# Patient Record
Sex: Female | Born: 1965
Health system: Southern US, Community
[De-identification: ages and names within clinical notes are randomized; demographics above are authoritative.]

## PROBLEM LIST (undated history)

## (undated) DIAGNOSIS — M1990 Unspecified osteoarthritis, unspecified site: Secondary | ICD-10-CM

## (undated) DIAGNOSIS — R7303 Prediabetes: Secondary | ICD-10-CM

## (undated) DIAGNOSIS — D649 Anemia, unspecified: Secondary | ICD-10-CM

## (undated) HISTORY — PX: DILATION AND CURETTAGE OF UTERUS: SHX78

## (undated) HISTORY — PX: WISDOM TOOTH EXTRACTION: SHX21

---

## 1998-11-01 ENCOUNTER — Other Ambulatory Visit: Admission: RE | Admit: 1998-11-01 | Discharge: 1998-11-01 | Payer: Self-pay | Admitting: Obstetrics and Gynecology

## 1999-11-11 ENCOUNTER — Other Ambulatory Visit: Admission: RE | Admit: 1999-11-11 | Discharge: 1999-11-11 | Payer: Self-pay | Admitting: Obstetrics and Gynecology

## 2000-11-20 ENCOUNTER — Other Ambulatory Visit: Admission: RE | Admit: 2000-11-20 | Discharge: 2000-11-20 | Payer: Self-pay | Admitting: Obstetrics and Gynecology

## 2001-12-02 ENCOUNTER — Other Ambulatory Visit: Admission: RE | Admit: 2001-12-02 | Discharge: 2001-12-02 | Payer: Self-pay | Admitting: *Deleted

## 2001-12-02 ENCOUNTER — Other Ambulatory Visit: Admission: RE | Admit: 2001-12-02 | Discharge: 2001-12-02 | Payer: Self-pay | Admitting: Obstetrics and Gynecology

## 2003-01-17 ENCOUNTER — Other Ambulatory Visit: Admission: RE | Admit: 2003-01-17 | Discharge: 2003-01-17 | Payer: Self-pay | Admitting: Obstetrics and Gynecology

## 2004-01-25 ENCOUNTER — Other Ambulatory Visit: Admission: RE | Admit: 2004-01-25 | Discharge: 2004-01-25 | Payer: Self-pay | Admitting: Obstetrics and Gynecology

## 2005-04-29 ENCOUNTER — Other Ambulatory Visit: Admission: RE | Admit: 2005-04-29 | Discharge: 2005-04-29 | Payer: Self-pay | Admitting: Obstetrics and Gynecology

## 2005-08-01 ENCOUNTER — Encounter (INDEPENDENT_AMBULATORY_CARE_PROVIDER_SITE_OTHER): Payer: Self-pay | Admitting: Specialist

## 2005-08-01 ENCOUNTER — Ambulatory Visit (HOSPITAL_COMMUNITY): Admission: RE | Admit: 2005-08-01 | Discharge: 2005-08-01 | Payer: Self-pay | Admitting: Obstetrics and Gynecology

## 2010-10-23 NOTE — H&P (Signed)
  Brianna Morton, Brianna Morton             ACCOUNT NO.:  [id number]  MEDICAL RECORD NO.:  [id number]        PATIENT TYPE:  WAMB  LOCATION:                                [facility]  [facility]  PHYSICIAN:  Duke Salvia. Marcelle Overlie, M.D.DATE OF BIRTH:  1965/12/06  DATE OF ADMISSION:  10/24/2010 DATE OF DISCHARGE:                             HISTORY & PHYSICAL   CHIEF COMPLAINT:  Menorrhagia.  HISTORY OF PRESENT ILLNESS:  A 45 year old, G1, P1 using condoms for contraception as noted worsening problems with menorrhagia over the last 6 months.  SHD October 07, 2010, shows a well-defined endometrial polyp. She has had a prior hysteroscopy and D and C in 2006 for the same problem.  Additionally, she is known to have fibroids, but does not want to pursue hysterectomy at this time.  Most recent ultrasound showed fibroids, the largest were 5.3, 5.3 and 3.7 cm and several smaller, two polyps 14 and 21-mm was noted on saline infusion.  She presents now for D and C, hysteroscopy.  This procedure including risks related to bleeding, infection, other complications that may require additional surgery all reviewed with her which she understands and accepts.  PAST MEDICAL HISTORY:  ALLERGIES:  None.  PRIOR OPERATIONS:  D and C, hysteroscopy in 2006, a vaginal delivery in 1992.  CURRENT MEDICATIONS:  Over-the-counter vitamins once daily.  REVIEW OF SYSTEMS:  Significant for anemia.  FAMILY HISTORY:  Significant for heart disease and breast cancer in an aunt.  SOCIAL HISTORY:  Denies alcohol, tobacco or drug use.  She is single. [facility] is her PCP.  PHYSICAL EXAMINATION:  VITAL SIGNS:  Temperature 98.2, blood pressure 128/72. HEENT:  Unremarkable. NECK:  Supple without masses. LUNGS:  Clear. CARDIOVASCULAR:  Rate and rhythm without murmurs, rubs or gallops. BREASTS:  Without masses. ABDOMEN:  Soft, flat and nontender. PELVIC:  Normal external genitalia.  Vagina and cervix  clear.  Uterus was 10-week size, irregular.  Adnexa negative.  IMPRESSION: 1. Leiomyoma. 2. Menorrhagia. 3. Endometrial polyps.  PLAN:  D and C, hysteroscopy.  Procedure and risks reviewed as above up.     [name] M. Marcelle Overlie, M.D.     [facility]/MEDQ  D:  10/18/2010  T:  10/19/2010  Job:  [id number]  Electronically Signed by Richarda Overlie M.D. on 10/23/2010 03:12:19 PM

## 2010-10-24 ENCOUNTER — Other Ambulatory Visit: Payer: Self-pay | Admitting: Obstetrics and Gynecology

## 2010-10-24 ENCOUNTER — Ambulatory Visit (HOSPITAL_COMMUNITY)
Admission: RE | Admit: 2010-10-24 | Discharge: 2010-10-24 | Disposition: A | Discharge status: Home / Self Care | Payer: BC Managed Care – PPO | Source: Ambulatory Visit | Attending: Obstetrics and Gynecology | Admitting: Obstetrics and Gynecology

## 2010-10-24 DIAGNOSIS — D251 Intramural leiomyoma of uterus: Secondary | ICD-10-CM | POA: Insufficient documentation

## 2010-10-24 DIAGNOSIS — D252 Subserosal leiomyoma of uterus: Secondary | ICD-10-CM | POA: Insufficient documentation

## 2010-10-24 DIAGNOSIS — N920 Excessive and frequent menstruation with regular cycle: Secondary | ICD-10-CM | POA: Insufficient documentation

## 2010-10-24 DIAGNOSIS — N840 Polyp of corpus uteri: Secondary | ICD-10-CM | POA: Insufficient documentation

## 2010-10-24 DIAGNOSIS — D250 Submucous leiomyoma of uterus: Secondary | ICD-10-CM | POA: Insufficient documentation

## 2010-10-24 LAB — CBC
MCH: 19.3 pg — ABNORMAL LOW (ref 26.0–34.0)
MCHC: 30.1 g/dL (ref 30.0–36.0)
MCV: 64.1 fL — ABNORMAL LOW (ref 78.0–100.0)
Platelets: 314 10*3/uL (ref 150–400)
RDW: 19.2 % — ABNORMAL HIGH (ref 11.5–15.5)

## 2010-10-25 NOTE — Op Note (Signed)
  Brianna Morton, Brianna Morton             ACCOUNT NO.:  [id number]  MEDICAL RECORD NO.:  [id number]           PATIENT TYPE:  O  LOCATION:  [facility]                          [facility]  [facility]  PHYSICIAN:  Duke Salvia. Marcelle Overlie, M.D.DATE OF BIRTH:  [birth year]  DATE OF PROCEDURE:  10/24/2010 DATE OF DISCHARGE:                              OPERATIVE REPORT   PREOPERATIVE DIAGNOSES:  Leiomyoma, abnormal uterine bleeding.  POSTOPERATIVE DIAGNOSES:  Leiomyoma, abnormal uterine bleeding.  PROCEDURE:  D and C, hysteroscopy.  SURGEON:  Duke Salvia. Marcelle Overlie, M.D.  ANESTHESIA:  General.  COMPLICATIONS:  None.  DRAINS:  Foley catheter.  ESTIMATED BLOOD LOSS:  Minimal.  SPECIMENS:  Endometrial curettings to Pathology.  PROCEDURE AND FINDINGS:  The patient was taken to the operating room after an adequate level of general anesthesia was obtained.  With the patient's legs in stirrups, the abdomen, perineum, and vagina were prepped and draped for D and C.  The bladder was drained, EUA carried out, uterus was 12-week size from known leiomyoma, adnexa negative. Uterus was mobile.  Cervix grasped with tenaculum.  Paracervical block created by infiltrating at 3 and 9 o'clock submucosally 5-7 mL 1% Xylocaine on either side after negative aspiration.  The uterus was sounded to 11 cm, progressively dilated to 27 [place].  The continuous flow hysteroscope was inserted.  An intramural partially submucous fibroid was noted on the right lateral wall.  No other abnormalities were noted.  Sharp curettage was carried out and sent as endometrial curetting specimen.  The scope was reinserted.  The cavity was irrigated.  Further assessment of the fibroids, especially since she has so many other multiple intramural and serosal fibroids, was that this fibroid was not readily accessible for complete excision.  The procedure was stopped at that point.  She tolerated this well, went to recovery room in good  condition.    Hashir Deleeuw M. Marcelle Overlie, M.D.    RM[facility]/MEDQ  D:  10/24/2010  T:  10/25/2010  Job:  [id number]  Electronically Signed by Richarda Overlie M.D. on 10/25/2010 11:37:17 AM

## 2011-02-07 NOTE — Op Note (Signed)
NAMEJASMENE, Brianna Morton             ACCOUNT NO.:  [id number]   MEDICAL RECORD NO.:  [id number]          PATIENT TYPE:  AMB   LOCATION:  [facility]                           [facility]ILITY[facility]  [facility]   PHYSICIAN:  Duke Salvia. Marcelle Overlie, M.D.DATE OF BIRTH:  1965/11/14   DATE OF PROCEDURE:  08/01/2005  DATE OF DISCHARGE:                                 OPERATIVE REPORT   PREOPERATIVE DIAGNOSIS:  Leiomyoma with anemia, endometrial polyp,  menorrhagia.   POSTOPERATIVE DIAGNOSIS:  Leiomyoma with anemia, endometrial polyp,  menorrhagia.   PROCEDURE:  D&C hysteroscopy.   SURGEON:  Duke Salvia. Marcelle Overlie, M.D.   ANESTHESIA:  Paracervical block plus sedation.   DESCRIPTION OF PROCEDURE:  The patient was taken to the operating room.  After adequate level of sedation was obtained with the patient's legs  stirrups. The perineum and vagina prepped and draped with Betadine. The  bladder was drained, EUA carried out. Uterus was upper limits normal size.  Adnexa negative. Speculum was positioned. Cervix grasped with tenaculum.  Paracervical block created by infiltrating at 3 and 9 o'clock submucosally 5  to 7 mL of 1% Xylocaine on either side after negative aspiration. The uterus  was then sounded to 9 cm, progressively dilated to 27-29 [facility] dilator. The  7 mm continuous flow hysteroscope was inserted and a moderate amount of  tissue was noted. The scope was removed. D&C was carried out. Endometrial  curettings were submitted to pathology. The scope was reinserted. The cavity  was irrigated and all four walls were noted to be clear and unremarkable.  Procedure was discontinued at that point. The patient went to recovery room  in good condition.      Richard M. Marcelle Overlie, M.D.  Electronically Signed     RMH/MEDQ  D:  08/01/2005  T:  08/01/2005  Job:  [id number]

## 2011-02-07 NOTE — H&P (Signed)
Brianna Morton, Brianna Morton             ACCOUNT NO.:  [id number]   MEDICAL RECORD NO.:  [id number]          PATIENT TYPE:  AMB   LOCATION:  [facility]                           [facility]   PHYSICIAN:  Duke Salvia. Marcelle Overlie, M.D.DATE OF BIRTH:  01/14/1966   DATE OF ADMISSION:  08/01/2005  DATE OF DISCHARGE:                                HISTORY & PHYSICAL   CHIEF COMPLAINT:  Anemia with menorrhagia.   HISTORY OF PRESENT ILLNESS:  A 45 year old G1, P1 currently using condoms.  She has a history of leiomyoma and presented in August of this year with the  uterus 10 weeks size by exam, was started on iron at that point, and a  sonohystogram was done that showed fibroids measuring 6.2 x 5.1, 5.6 x 5.3,  3.2 x 2.6, with a 19 mm polyp noted within the uterine cavity.  We discussed  several options including hysterectomy, which she declines.  She prefers to  be more conservative, and presents now for a D&C hysteroscopy to remove the  polyp.  This procedure including risks of bleeding, infection, adjacent  organ injury, or other complications that may require additional surgery  were all reviewed.  Hemoglobin in August was 8.6.  She has been on  prescription iron once daily.   PAST MEDICAL HISTORY:   SURGICAL HISTORY:  One vaginal delivery at term.   FAMILY HISTORY:  Noncontributory.   LABORATORY DATA:  Last Pap in August of 2006 was normal.  Lipid screen on  June 19, 2005 showed hypercholesterolemia.   PHYSICAL EXAMINATION:  VITAL SIGNS:  Temperature 98.2, blood pressure  110/84.  HEENT:  Unremarkable.  NECK:  Supple without masses.  LUNGS:  Clear.  CARDIOVASCULAR:  Regular rate and rhythm without murmurs, rubs, or gallops.  BREASTS:  Without masses.  ABDOMEN:  Soft, flat, nontender.  PELVIC:  Normal external genitalia.  Vagina and cervix clear.  Uterus is 10  weeks size.  Adnexa negative.  EXTREMITIES/NEUROLOGIC:  Unremarkable.   IMPRESSION:  Leiomyoma with endometrial polyp and  anemia secondary to  menorrhagia.   PLAN:  D&C hysterectomy.  Procedure and risks discussed, as above.     Richard M. Marcelle Overlie, M.D.  Electronically Signed    RMH/MEDQ  D:  07/28/2005  T:  07/28/2005  Job:  [id number]

## 2013-09-01 ENCOUNTER — Telehealth: Payer: Self-pay | Admitting: Family Medicine

## 2013-09-01 NOTE — Telephone Encounter (Signed)
appt scheduled

## 2013-09-02 ENCOUNTER — Ambulatory Visit: Payer: Self-pay | Admitting: Family Medicine

## 2013-09-09 ENCOUNTER — Ambulatory Visit: Payer: Self-pay | Admitting: Family Medicine

## 2013-10-06 ENCOUNTER — Ambulatory Visit: Payer: Self-pay | Admitting: Family Medicine

## 2014-11-14 ENCOUNTER — Encounter: Payer: Self-pay | Admitting: Nurse Practitioner

## 2014-11-14 ENCOUNTER — Ambulatory Visit (INDEPENDENT_AMBULATORY_CARE_PROVIDER_SITE_OTHER): Payer: Managed Care, Other (non HMO) | Admitting: Nurse Practitioner

## 2014-11-14 VITALS — BP 166/87 | HR 89 | Temp 97.9°F | Ht 63.0 in | Wt 292.0 lb

## 2014-11-14 DIAGNOSIS — R300 Dysuria: Secondary | ICD-10-CM

## 2014-11-14 DIAGNOSIS — N3001 Acute cystitis with hematuria: Secondary | ICD-10-CM

## 2014-11-14 DIAGNOSIS — N760 Acute vaginitis: Secondary | ICD-10-CM

## 2014-11-14 LAB — POCT URINALYSIS DIPSTICK
Bilirubin, UA: NEGATIVE
Glucose, UA: NEGATIVE
Ketones, UA: NEGATIVE
NITRITE UA: NEGATIVE
PH UA: 5
Protein, UA: NEGATIVE
RBC UA: NEGATIVE
Spec Grav, UA: 1.02
UROBILINOGEN UA: NEGATIVE

## 2014-11-14 LAB — POCT UA - MICROSCOPIC ONLY
Bacteria, U Microscopic: NEGATIVE
CASTS, UR, LPF, POC: NEGATIVE
Crystals, Ur, HPF, POC: NEGATIVE
MUCUS UA: NEGATIVE
RBC, urine, microscopic: NEGATIVE

## 2014-11-14 MED ORDER — CIPROFLOXACIN HCL 500 MG PO TABS: 500.0000 mg | ORAL_TABLET | Freq: Two times a day (BID) | ORAL | Status: DC

## 2014-11-14 MED ORDER — FLUCONAZOLE 150 MG PO TABS: 150.0000 mg | ORAL_TABLET | Freq: Once | ORAL | Status: DC

## 2014-11-14 NOTE — Progress Notes (Signed)
  Subjective:    Brianna Morton is a 49 y.o. female who complains of abnormal smelling urine, foul smelling urine and frequency. She has had symptoms for 2 weeks. Patient also complains of vaginal discharge. Patient denies back pain, congestion and fever. Patient does not have a history of recurrent UTI. Patient does not have a history of pyelonephritis.   The following portions of the patient's history were reviewed and updated as appropriate: allergies, current medications, past family history, past medical history, past social history, past surgical history and problem list.  Review of Systems Pertinent items are noted in HPI.    Objective:    BP 166/87 mmHg  Pulse 89  Temp(Src) 97.9 F (36.6 C) (Oral)  Ht 5\' 3"  (1.6 m)  Wt 292 lb (132.45 kg)  BMI 51.74 kg/m2  LMP 06/14/2014 General appearance: alert and cooperative Lungs: clear to auscultation bilaterally Heart: regular rate and rhythm, S1, S2 normal, no murmur, click, rub or gallop Abdomen: soft, non-tender; bowel sounds normal; no masses,  no organomegaly  Laboratory:  Results for orders placed or performed in visit on 11/14/14  POCT UA - Microscopic Only  Result Value Ref Range   WBC, Ur, HPF, POC 10-15    RBC, urine, microscopic neg    Bacteria, U Microscopic neg    Mucus, UA neg    Epithelial cells, urine per micros occ    Crystals, Ur, HPF, POC neg    Casts, Ur, LPF, POC neg    Yeast, UA occ   POCT urinalysis dipstick  Result Value Ref Range   Color, UA yellow    Clarity, UA cloudy    Glucose, UA neg    Bilirubin, UA neg    Ketones, UA neg    Spec Grav, UA 1.020    Blood, UA neg    pH, UA 5.0    Protein, UA neg    Urobilinogen, UA negative    Nitrite, UA neg    Leukocytes, UA Trace      Assessment:    Acute cystitis and Vulvovaginitis     Plan:      1. Dysuria - POCT UA - Microscopic Only - POCT urinalysis dipstick  2. Acute cystitis with hematuria [N30.01] Take medication as  prescribe Cotton underwear Take shower not bath Cranberry juice, yogurt Force fluids AZO over the counter X2 days Culture pending RTO prn - ciprofloxacin (CIPRO) 500 MG tablet; Take 1 tablet (500 mg total) by mouth 2 (two) times daily.  Dispense: 6 tablet; Refill: 0  3. Vaginitis and vulvovaginitis - fluconazole (DIFLUCAN) 150 MG tablet; Take 1 tablet (150 mg total) by mouth once.  Dispense: 1 tablet; Refill: 0   Mary-Margaret Hassell Done, FNP

## 2014-11-14 NOTE — Patient Instructions (Signed)

## 2015-01-24 ENCOUNTER — Ambulatory Visit: Payer: Managed Care, Other (non HMO) | Admitting: Family

## 2015-01-26 ENCOUNTER — Encounter: Payer: Self-pay | Admitting: Nurse Practitioner

## 2015-01-26 ENCOUNTER — Ambulatory Visit (INDEPENDENT_AMBULATORY_CARE_PROVIDER_SITE_OTHER): Payer: Managed Care, Other (non HMO) | Admitting: Nurse Practitioner

## 2015-01-26 VITALS — BP 156/90 | HR 103 | Temp 99.2°F | Ht 63.0 in | Wt 294.0 lb

## 2015-01-26 DIAGNOSIS — R300 Dysuria: Secondary | ICD-10-CM

## 2015-01-26 LAB — POCT URINALYSIS DIPSTICK
BILIRUBIN UA: NEGATIVE
GLUCOSE UA: NEGATIVE
KETONES UA: NEGATIVE
LEUKOCYTES UA: NEGATIVE
NITRITE UA: NEGATIVE
Protein, UA: NEGATIVE
Spec Grav, UA: 1.01
Urobilinogen, UA: NEGATIVE
pH, UA: 6.5

## 2015-01-26 LAB — POCT UA - MICROSCOPIC ONLY
BACTERIA, U MICROSCOPIC: NEGATIVE
CASTS, UR, LPF, POC: NEGATIVE
CRYSTALS, UR, HPF, POC: NEGATIVE
MUCUS UA: NEGATIVE
WBC, Ur, HPF, POC: NEGATIVE
YEAST UA: NEGATIVE

## 2015-01-26 NOTE — Patient Instructions (Signed)

## 2015-01-26 NOTE — Progress Notes (Signed)
  Subjective:     CHIZUKO TRINE is a 49 y.o. female who presents for evaluation of sinus pain. Symptoms include: congestion, cough, fevers and sinus pressure. Onset of symptoms was 2 days ago. Symptoms have been unchanged since that time. Past history is significant for no history of pneumonia or bronchitis. Patient is a non-smoker.  * had a uti 1 month ago and just wants urine checked.  The following portions of the patient's history were reviewed and updated as appropriate: allergies, current medications, past family history, past medical history, past social history, past surgical history and problem list.  Review of Systems Pertinent items are noted in HPI.   Objective:    BP 156/90 mmHg  Pulse 103  Temp(Src) 99.2 F (37.3 C) (Oral)  Ht 5\' 3"  (1.6 m)  Wt 294 lb (133.358 kg)  BMI 52.09 kg/m2 General appearance: alert and cooperative Eyes: conjunctivae/corneas clear. PERRL, EOM's intact. Fundi benign. Ears: normal TM's and external ear canals both ears Nose: Nares normal. Septum midline. Mucosa normal. No drainage or sinus tenderness. mild congestion Throat: lips, mucosa, and tongue normal; teeth and gums normal Neck: no adenopathy, no carotid bruit, no JVD, supple, symmetrical, trachea midline and thyroid not enlarged, symmetric, no tenderness/mass/nodules Lungs: clear to auscultation bilaterally Heart: regular rate and rhythm, S1, S2 normal, no murmur, click, rub or gallop    Urinalysis clear    Assessment:    Acute viral sinusitis.    Plan:   1. Take meds as prescribed 2. Use a cool mist humidifier especially during the winter months and when heat has been humid. 3. Use saline nose sprays frequently 4. Saline irrigations of the nose can be very helpful if done frequently.  * 4X daily for 1 week*  * Use of a nettie pot can be helpful with this. Follow directions with this* 5. Drink plenty of fluids 6. Keep thermostat turn down low 7.For any cough or  congestion  Use plain Mucinex- regular strength or max strength is fine   * Children- consult with Pharmacist for dosing 8. For fever or aces or pains- take tylenol or ibuprofen appropriate for age and weight.  * for fevers greater than 101 orally you may alternate ibuprofen and tylenol every  3 hours.   Mary-Margaret Hassell Done, FNP

## 2015-01-29 ENCOUNTER — Telehealth: Payer: Self-pay | Admitting: Nurse Practitioner

## 2015-01-29 NOTE — Telephone Encounter (Signed)
Appointment given for tomorrow at 4:45 with Ronnald Collum, [place].

## 2015-01-29 NOTE — Telephone Encounter (Signed)
ntbs

## 2015-01-29 NOTE — Telephone Encounter (Signed)
Patient states that she feels like she is wheezing now and it is turning into bronchitis and would like antibiotc. Patient states that she has been running a low grade fever and would like antibiotic

## 2015-01-30 ENCOUNTER — Encounter: Payer: Self-pay | Admitting: Nurse Practitioner

## 2015-01-30 ENCOUNTER — Ambulatory Visit (INDEPENDENT_AMBULATORY_CARE_PROVIDER_SITE_OTHER): Payer: Managed Care, Other (non HMO) | Admitting: Nurse Practitioner

## 2015-01-30 VITALS — BP 141/93 | HR 92 | Temp 98.0°F | Ht 63.0 in | Wt 291.0 lb

## 2015-01-30 DIAGNOSIS — J209 Acute bronchitis, unspecified: Secondary | ICD-10-CM

## 2015-01-30 MED ORDER — METHYLPREDNISOLONE ACETATE 80 MG/ML IJ SUSP
80.0000 mg | Freq: Once | INTRAMUSCULAR | Status: AC
  Administered 2015-01-30: 80 mg via INTRAMUSCULAR

## 2015-01-30 MED ORDER — PREDNISONE 20 MG PO TABS: ORAL_TABLET | ORAL | Status: DC

## 2015-01-30 NOTE — Progress Notes (Signed)
   Subjective:    Patient ID: Brianna Morton, female    DOB: Apr 26, 1966, 49 y.o.   MRN: [id number]  HPI Patient was seen on 5/6 with dx of bronchitis- patient says that she is some better but is still wheezing and coughing. She no  Longer has a fever.    Review of Systems  Constitutional: Negative.   HENT: Negative.   Respiratory: Positive for cough and wheezing.   Cardiovascular: Negative.   Genitourinary: Negative.   Neurological: Negative.   Psychiatric/Behavioral: Negative.   All other systems reviewed and are negative.      Objective:   Physical Exam  Constitutional: She appears well-developed and well-nourished. No distress.  Cardiovascular: Normal rate, regular rhythm and normal heart sounds.   Pulmonary/Chest: Effort normal. She has wheezes (faint inspiratory wheezes).  Neurological: She is alert.  Skin: Skin is warm and dry.  Psychiatric: She has a normal mood and affect. Her behavior is normal. Judgment and thought content normal.   BP 141/93 mmHg  Pulse 92  Temp(Src) 98 F (36.7 C) (Oral)  Ht 5\' 3"  (1.6 m)  Wt 291 lb (131.997 kg)  BMI 51.56 kg/m2        Assessment & Plan:  1. Take meds as prescribed 2. Use a cool mist humidifier especially during the winter months and when heat has been humid. 3. Use saline nose sprays frequently 4. Saline irrigations of the nose can be very helpful if done frequently.  * 4X daily for 1 week*  * Use of a nettie pot can be helpful with this. Follow directions with this* 5. Drink plenty of fluids 6. Keep thermostat turn down low 7.For any cough or congestion  Use plain Mucinex- regular strength or max strength is fine   * Children- consult with Pharmacist for dosing 8. For fever or aces or pains- take tylenol or ibuprofen appropriate for age and weight.  * for fevers greater than 101 orally you may alternate ibuprofen and tylenol every  3 hours.   Meds ordered this encounter  Medications  . methylPREDNISolone  acetate (DEPO-MEDROL) injection 80 mg    Sig:   . predniSONE (DELTASONE) 20 MG tablet    Sig: 2 po at sametime daily for 5 days- start tomorrow    Dispense:  10 tablet    Refill:  0    Order Specific Question:  Supervising Provider    Answer:  Chipper Herb [[id number]]   [place], FNP

## 2015-01-30 NOTE — Patient Instructions (Signed)

## 2015-05-30 ENCOUNTER — Other Ambulatory Visit: Payer: Self-pay | Admitting: Obstetrics and Gynecology

## 2015-06-01 LAB — CYTOLOGY - PAP

## 2016-03-19 ENCOUNTER — Ambulatory Visit (INDEPENDENT_AMBULATORY_CARE_PROVIDER_SITE_OTHER): Payer: 59 | Admitting: Physician Assistant

## 2016-03-19 ENCOUNTER — Ambulatory Visit (INDEPENDENT_AMBULATORY_CARE_PROVIDER_SITE_OTHER): Payer: 59

## 2016-03-19 ENCOUNTER — Ambulatory Visit: Payer: Managed Care, Other (non HMO) | Admitting: Family Medicine

## 2016-03-19 ENCOUNTER — Encounter: Payer: Self-pay | Admitting: Physician Assistant

## 2016-03-19 VITALS — BP 132/74 | HR 78 | Temp 97.0°F | Ht 63.0 in | Wt 291.8 lb

## 2016-03-19 DIAGNOSIS — M79641 Pain in right hand: Secondary | ICD-10-CM | POA: Diagnosis not present

## 2016-03-19 NOTE — Progress Notes (Signed)
Subjective:     Patient ID: Brianna Morton, female   DOB: [birth year], 50 y.o.   MRN: [id number]  HPI Pt with prox R thumb/hand pain She denies any injury but lift 20lb boxes for work [employer] like it comes out of place No meds for sx  Review of Systems + pain to the thumb No numbness No locking of the thumb    Objective:   Physical Exam No ecchy or edema to the thumb or hand FROM of the thumb Some sx with full flexion Good sensory/cap rf Sl TTP to the metacarpal mainly prox No TTP of the phalanx Xray- no acute findings     Assessment:     1. Right hand pain        Plan:     Heat/Ice OTC meds for sx OTC bracing F/U prn

## 2016-03-19 NOTE — Patient Instructions (Signed)
Tendinitis °Tendinitis is swelling and inflammation of the tendons. Tendons are band-like tissues that connect muscle to bone. Tendinitis commonly occurs in the:  °· Shoulders (rotator cuff). °· Heels (Achilles tendon). °· Elbows (triceps tendon). °CAUSES °Tendinitis is usually caused by overusing the tendon, muscles, and joints involved. When the tissue surrounding a tendon (synovium) becomes inflamed, it is called tenosynovitis. Tendinitis commonly develops in people whose jobs require repetitive motions. °SYMPTOMS °· Pain. °· Tenderness. °· Mild swelling. °DIAGNOSIS °Tendinitis is usually diagnosed by physical exam. Your health care provider may also order X-rays or other imaging tests. °TREATMENT °Your health care provider may recommend certain medicines or exercises for your treatment. °HOME CARE INSTRUCTIONS  °· Use a sling or splint for as long as directed by your health care provider until the pain decreases. °· Put ice on the injured area. °¨ Put ice in a plastic bag. °¨ Place a towel between your skin and the bag. °¨ Leave the ice on for 15-20 minutes, 3-4 times a day, or as directed by your health care provider. °· Avoid using the limb while the tendon is painful. Perform gentle range of motion exercises only as directed by your health care provider. Stop exercises if pain or discomfort increase, unless directed otherwise by your health care provider. °· Only take over-the-counter or prescription medicines for pain, discomfort, or fever as directed by your health care provider. °SEEK MEDICAL CARE IF:  °· Your pain and swelling increase. °· You develop new, unexplained symptoms, especially increased numbness in the hands. °MAKE SURE YOU:  °· Understand these instructions. °· Will watch your condition. °· Will get help right away if you are not doing well or get worse. °  °This information is not intended to replace advice given to you by your health care provider. Make sure you discuss any questions you  have with your health care provider. °  °Document Released: 09/05/2000 Document Revised: 09/29/2014 Document Reviewed: 11/25/2010 °Elsevier Interactive Patient Education ©2016 [employer] ° °

## 2016-03-21 ENCOUNTER — Telehealth: Payer: Self-pay | Admitting: Nurse Practitioner

## 2016-03-21 NOTE — Telephone Encounter (Signed)
Pt is stating that she was supposed to have a urine done at her appt 6/28 and that she left a sample in the bathroom. I don't see it in [facility] notes anywhere and she had been here for right hand pain. She would like to come drop a urine off to have it tested as she thinks she has a UTI. Please advise.

## 2016-04-11 ENCOUNTER — Telehealth: Payer: Self-pay | Admitting: Nurse Practitioner

## 2016-04-11 DIAGNOSIS — R399 Unspecified symptoms and signs involving the genitourinary system: Secondary | ICD-10-CM

## 2016-04-11 NOTE — Telephone Encounter (Signed)
Pt is aware and she was upset and states that she will just take her care to [facility].

## 2016-04-11 NOTE — Telephone Encounter (Signed)
Patient does not understand why [facility] could not check her urine and not charge for an office visit since it was suppose to be done on the visit with Osa Craver.  I said I would ask if she could have a urine check.  Please advise if she could come in to leave a specimen Saturday morning.  She can be reached at [phone].

## 2016-04-11 NOTE — Telephone Encounter (Signed)
Will ntbs to discuss

## 2016-04-12 NOTE — Telephone Encounter (Signed)
Per Alyse Low, ok for patient to come in and leave urine only, she has placed order in chart.  Contacted patient and informed, she is trying to come in today but it may be Monday before she can get in.

## 2016-04-12 NOTE — Telephone Encounter (Signed)
Urinalysis ordered

## 2016-04-12 NOTE — Addendum Note (Signed)
Addended by: Evelina Dun A on: 04/12/2016 08:03 AM   Modules accepted: Orders

## 2016-04-14 ENCOUNTER — Other Ambulatory Visit: Payer: 59

## 2016-04-14 DIAGNOSIS — Z87440 Personal history of urinary (tract) infections: Secondary | ICD-10-CM

## 2016-04-14 LAB — URINALYSIS, COMPLETE
Bilirubin, UA: NEGATIVE
Glucose, UA: NEGATIVE
Ketones, UA: NEGATIVE
Leukocytes, UA: NEGATIVE
NITRITE UA: NEGATIVE
PH UA: 5.5 (ref 5.0–7.5)
Protein, UA: NEGATIVE
RBC, UA: NEGATIVE
Specific Gravity, UA: 1.02 (ref 1.005–1.030)
Urobilinogen, Ur: 0.2 mg/dL (ref 0.2–1.0)

## 2016-04-14 LAB — MICROSCOPIC EXAMINATION

## 2016-04-15 LAB — URINE CULTURE

## 2016-05-02 ENCOUNTER — Telehealth: Payer: Self-pay | Admitting: Nurse Practitioner

## 2016-05-05 NOTE — Telephone Encounter (Signed)
Patient aware of xray results.   

## 2016-10-21 ENCOUNTER — Ambulatory Visit (INDEPENDENT_AMBULATORY_CARE_PROVIDER_SITE_OTHER): Payer: 59 | Admitting: Family

## 2016-10-21 ENCOUNTER — Encounter: Payer: Self-pay | Admitting: Family

## 2016-10-21 VITALS — BP 134/83 | HR 89 | Temp 98.9°F | Ht 63.0 in | Wt 302.0 lb

## 2016-10-21 DIAGNOSIS — J209 Acute bronchitis, unspecified: Secondary | ICD-10-CM | POA: Diagnosis not present

## 2016-10-21 MED ORDER — BENZONATATE 200 MG PO CAPS: 200.0000 mg | ORAL_CAPSULE | Freq: Three times a day (TID) | ORAL | 1 refills | Status: DC | PRN

## 2016-10-21 MED ORDER — AZITHROMYCIN 250 MG PO TABS: ORAL_TABLET | ORAL | 0 refills | Status: DC

## 2016-10-21 NOTE — Patient Instructions (Signed)

## 2016-10-21 NOTE — Progress Notes (Signed)
   Subjective:    Patient ID: Brianna Morton, female    DOB: 22-May-1966, 51 y.o.   MRN: [id number]  Cough  This is a new problem. The current episode started in the past 7 days. The problem has been gradually worsening. The problem occurs every few minutes. The cough is productive of sputum. Associated symptoms include chills, ear congestion, a fever, nasal congestion, postnasal drip, rhinorrhea, a sore throat, shortness of breath and wheezing. Pertinent negatives include no ear pain, headaches or myalgias. The symptoms are aggravated by lying down. She has tried rest and OTC cough suppressant for the symptoms. The treatment provided mild relief. There is no history of asthma.      Review of Systems  Constitutional: Positive for chills and fever.  HENT: Positive for postnasal drip, rhinorrhea and sore throat. Negative for ear pain.   Respiratory: Positive for cough, shortness of breath and wheezing.   Musculoskeletal: Negative for myalgias.  Neurological: Negative for headaches.  All other systems reviewed and are negative.      Objective:   Physical Exam  Constitutional: She is oriented to person, place, and time. She appears well-developed and well-nourished. No distress.  HENT:  Head: Normocephalic and atraumatic.  Right Ear: External ear normal.  Left Ear: External ear normal.  Nose: Mucosal edema and rhinorrhea present.  Mouth/Throat: Posterior oropharyngeal erythema present.  Eyes: Pupils are equal, round, and reactive to light.  Neck: Normal range of motion. Neck supple. No thyromegaly present.  Cardiovascular: Normal rate, regular rhythm, normal heart sounds and intact distal pulses.   No murmur heard. Pulmonary/Chest: Effort normal and breath sounds normal. No respiratory distress. She has no wheezes.  Abdominal: Soft. Bowel sounds are normal. She exhibits no distension. There is no tenderness.  Musculoskeletal: Normal range of motion. She exhibits no edema or  tenderness.  Neurological: She is alert and oriented to person, place, and time.  Skin: Skin is warm and dry.  Psychiatric: She has a normal mood and affect. Her behavior is normal. Judgment and thought content normal.  Vitals reviewed.     BP 134/83   Pulse 89   Temp 98.9 F (37.2 C) (Oral)   Ht 5\' 3"  (1.6 m)   Wt (!) 302 lb (137 kg)   BMI 53.50 kg/m      Assessment & Plan:  1. Acute bronchitis, unspecified organism - Take meds as prescribed - Use a cool mist humidifier  -Use saline nose sprays frequently -Saline irrigations of the nose can be very helpful if done frequently.  * 4X daily for 1 week*  * Use of a nettie pot can be helpful with this. Follow directions with this* -Force fluids -For any cough or congestion  Use plain Mucinex- regular strength or max strength is fine   * Children- consult with Pharmacist for dosing -For fever or aces or pains- take tylenol or ibuprofen appropriate for age and weight.  * for fevers greater than 101 orally you may alternate ibuprofen and tylenol every  3 hours. -Throat lozenges if help - azithromycin (ZITHROMAX) 250 MG tablet; Take 500 mg once, then 250 mg for four days  Dispense: 6 tablet; Refill: 0 - benzonatate (TESSALON) 200 MG capsule; Take 1 capsule (200 mg total) by mouth 3 (three) times daily as needed.  Dispense: 30 capsule; Refill: [place], FNP

## 2016-12-02 DIAGNOSIS — Z131 Encounter for screening for diabetes mellitus: Secondary | ICD-10-CM | POA: Diagnosis not present

## 2016-12-02 DIAGNOSIS — R946 Abnormal results of thyroid function studies: Secondary | ICD-10-CM | POA: Diagnosis not present

## 2016-12-02 DIAGNOSIS — E7800 Pure hypercholesterolemia, unspecified: Secondary | ICD-10-CM | POA: Diagnosis not present

## 2016-12-02 DIAGNOSIS — Z0000 Encounter for general adult medical examination without abnormal findings: Secondary | ICD-10-CM | POA: Diagnosis not present

## 2017-02-11 ENCOUNTER — Encounter: Payer: Self-pay | Admitting: Family

## 2017-02-11 ENCOUNTER — Ambulatory Visit (INDEPENDENT_AMBULATORY_CARE_PROVIDER_SITE_OTHER): Payer: 59 | Admitting: Family

## 2017-02-11 VITALS — BP 119/88 | HR 82 | Temp 98.0°F | Ht 63.0 in | Wt 294.8 lb

## 2017-02-11 DIAGNOSIS — R399 Unspecified symptoms and signs involving the genitourinary system: Secondary | ICD-10-CM | POA: Diagnosis not present

## 2017-02-11 DIAGNOSIS — N3001 Acute cystitis with hematuria: Secondary | ICD-10-CM | POA: Diagnosis not present

## 2017-02-11 LAB — URINALYSIS, COMPLETE
Bilirubin, UA: NEGATIVE
Glucose, UA: NEGATIVE
Ketones, UA: NEGATIVE
Nitrite, UA: NEGATIVE
PH UA: 5.5 (ref 5.0–7.5)
PROTEIN UA: NEGATIVE
Urobilinogen, Ur: 0.2 mg/dL (ref 0.2–1.0)

## 2017-02-11 LAB — MICROSCOPIC EXAMINATION
Epithelial Cells (non renal): >10 /hpf — AB (ref 0–10)
RENAL EPITHEL UA: NONE SEEN /HPF

## 2017-02-11 MED ORDER — SULFAMETHOXAZOLE-TRIMETHOPRIM 800-160 MG PO TABS: 1.0000 | ORAL_TABLET | Freq: Two times a day (BID) | ORAL | 0 refills | Status: DC

## 2017-02-11 NOTE — Progress Notes (Signed)
   Subjective:    Patient ID: Brianna Morton, female    DOB: 19-Jul-1966, 51 y.o.   MRN: [id number]  Abdominal Pain  This is a new problem. The current episode started 1 to 4 weeks ago. The onset quality is gradual. The problem occurs intermittently. The problem has been gradually worsening. The pain is located in the RLQ. The pain is at a severity of 2/10. The pain is moderate. The quality of the pain is aching. The abdominal pain does not radiate. Associated symptoms include dysuria. Pertinent negatives include no frequency, hematuria, nausea or vomiting. Associated symptoms comments: Urgency  . She has tried nothing for the symptoms. The treatment provided no relief.      Review of Systems  Gastrointestinal: Positive for abdominal pain. Negative for nausea and vomiting.  Genitourinary: Positive for dysuria. Negative for frequency and hematuria.  All other systems reviewed and are negative.      Objective:   Physical Exam  Constitutional: She is oriented to person, place, and time. She appears well-developed and well-nourished. No distress.  HENT:  Head: Normocephalic.  Eyes: Pupils are equal, round, and reactive to light.  Neck: Normal range of motion. Neck supple. No thyromegaly present.  Cardiovascular: Normal rate, regular rhythm, normal heart sounds and intact distal pulses.   No murmur heard. Pulmonary/Chest: Effort normal and breath sounds normal. No respiratory distress. She has no wheezes.  Abdominal: Soft. Bowel sounds are normal. She exhibits no distension. There is tenderness (RLQ pain).  Musculoskeletal: Normal range of motion. She exhibits no edema or tenderness.  Neurological: She is alert and oriented to person, place, and time.  Skin: Skin is warm and dry.  Psychiatric: She has a normal mood and affect. Her behavior is normal. Judgment and thought content normal.  Vitals reviewed.    BP 119/88   Pulse 82   Temp 98 F (36.7 C) (Oral)   Ht 5\' 3"  (1.6 m)    Wt 294 lb 12.8 oz (133.7 kg)   BMI 52.22 kg/m       Assessment & Plan:  1. UTI symptoms - Urinalysis, Complete  2. Acute cystitis with hematuria Force fluids AZO over the counter X2 days RTO prn Culture pending - sulfamethoxazole-trimethoprim (BACTRIM DS) 800-160 MG tablet; Take 1 tablet by mouth 2 (two) times daily.  Dispense: 14 tablet; Refill: 0 - Urine culture   Evelina Dun, FNP

## 2017-02-11 NOTE — Patient Instructions (Signed)

## 2017-02-12 LAB — URINE CULTURE

## 2017-02-17 NOTE — Progress Notes (Signed)
Patient aware.

## 2017-02-24 ENCOUNTER — Telehealth: Payer: Self-pay | Admitting: Family

## 2017-02-24 MED ORDER — CIPROFLOXACIN HCL 500 MG PO TABS: 500.0000 mg | ORAL_TABLET | Freq: Two times a day (BID) | ORAL | 0 refills | Status: DC

## 2017-02-24 NOTE — Telephone Encounter (Signed)
Please advise 

## 2017-02-24 NOTE — Telephone Encounter (Signed)
Pt aware Rx sent to pharmacy 

## 2017-02-24 NOTE — Telephone Encounter (Signed)
Cipro Prescription sent to pharmacy   

## 2017-03-12 ENCOUNTER — Telehealth: Payer: Self-pay | Admitting: Family

## 2017-03-12 MED ORDER — FLUCONAZOLE 150 MG PO TABS: 150.0000 mg | ORAL_TABLET | ORAL | 0 refills | Status: DC | PRN

## 2017-03-12 NOTE — Telephone Encounter (Signed)
Prescription sent to pharmacy.

## 2017-03-12 NOTE — Telephone Encounter (Signed)
Please advise and route to pool A 

## 2017-03-12 NOTE — Telephone Encounter (Signed)
Pt aware Rx sent to pharmacy 

## 2017-03-31 ENCOUNTER — Encounter: Payer: Self-pay | Admitting: Family

## 2017-03-31 ENCOUNTER — Ambulatory Visit (INDEPENDENT_AMBULATORY_CARE_PROVIDER_SITE_OTHER): Payer: 59 | Admitting: Family

## 2017-03-31 VITALS — BP 118/79 | HR 91 | Temp 97.8°F | Ht 63.0 in | Wt 294.0 lb

## 2017-03-31 DIAGNOSIS — K649 Unspecified hemorrhoids: Secondary | ICD-10-CM | POA: Diagnosis not present

## 2017-03-31 MED ORDER — HYDROCORTISONE ACETATE 25 MG RE SUPP: 25.0000 mg | Freq: Two times a day (BID) | RECTAL | 0 refills | Status: DC

## 2017-03-31 NOTE — Patient Instructions (Signed)
Hemorrhoids Hemorrhoids are swollen veins in and around the rectum or anus. There are two types of hemorrhoids:  Internal hemorrhoids. These occur in the veins that are just inside the rectum. They may poke through to the outside and become irritated and painful.  External hemorrhoids. These occur in the veins that are outside of the anus and can be felt as a painful swelling or hard lump near the anus.  Most hemorrhoids do not cause serious problems, and they can be managed with home treatments such as diet and lifestyle changes. If home treatments do not help your symptoms, procedures can be done to shrink or remove the hemorrhoids. What are the causes? This condition is caused by increased pressure in the anal area. This pressure may result from various things, including:  Constipation.  Straining to have a bowel movement.  Diarrhea.  Pregnancy.  Obesity.  Sitting for long periods of time.  Heavy lifting or other activity that causes you to strain.  Anal sex.  What are the signs or symptoms? Symptoms of this condition include:  Pain.  Anal itching or irritation.  Rectal bleeding.  Leakage of stool (feces).  Anal swelling.  One or more lumps around the anus.  How is this diagnosed? This condition can often be diagnosed through a visual exam. Other exams or tests may also be done, such as:  Examination of the rectal area with a gloved hand (digital rectal exam).  Examination of the anal canal using a small tube (anoscope).  A blood test, if you have lost a significant amount of blood.  A test to look inside the colon (sigmoidoscopy or colonoscopy).  How is this treated? This condition can usually be treated at home. However, various procedures may be done if dietary changes, lifestyle changes, and other home treatments do not help your symptoms. These procedures can help make the hemorrhoids smaller or remove them completely. Some of these procedures involve  surgery, and others do not. Common procedures include:  Rubber band ligation. Rubber bands are placed at the base of the hemorrhoids to cut off the blood supply to them.  Sclerotherapy. Medicine is injected into the hemorrhoids to shrink them.  Infrared coagulation. A type of light energy is used to get rid of the hemorrhoids.  Hemorrhoidectomy surgery. The hemorrhoids are surgically removed, and the veins that supply them are tied off.  Stapled hemorrhoidopexy surgery. A circular stapling device is used to remove the hemorrhoids and use staples to cut off the blood supply to them.  Follow these instructions at home: Eating and drinking  Eat foods that have a lot of fiber in them, such as whole grains, beans, nuts, fruits, and vegetables. Ask your health care provider about taking products that have added fiber (fiber supplements).  Drink enough fluid to keep your urine clear or pale yellow. Managing pain and swelling  Take warm sitz baths for 20 minutes, 3-4 times a day to ease pain and discomfort.  If directed, apply ice to the affected area. Using ice packs between sitz baths may be helpful. ? Put ice in a plastic bag. ? Place a towel between your skin and the bag. ? Leave the ice on for 20 minutes, 2-3 times a day. General instructions  Take over-the-counter and prescription medicines only as told by your health care provider.  Use medicated creams or suppositories as told.  Exercise regularly.  Go to the bathroom when you have the urge to have a bowel movement. Do not wait.    Avoid straining to have bowel movements.  Keep the anal area dry and clean. Use wet toilet paper or moist towelettes after a bowel movement.  Do not sit on the toilet for long periods of time. This increases blood pooling and pain. Contact a health care provider if:  You have increasing pain and swelling that are not controlled by treatment or medicine.  You have uncontrolled bleeding.  You  have difficulty having a bowel movement, or you are unable to have a bowel movement.  You have pain or inflammation outside the area of the hemorrhoids. This information is not intended to replace advice given to you by your health care provider. Make sure you discuss any questions you have with your health care provider. Document Released: 09/05/2000 Document Revised: 02/06/2016 Document Reviewed: 05/23/2015 Elsevier Interactive Patient Education  2017 [employer]  

## 2017-03-31 NOTE — Progress Notes (Signed)
   Subjective:    Patient ID: Brianna Morton, female    DOB: Jun 29, 1966, 51 y.o.   MRN: [id number]  HPI Pt presents to the office today with bloody stools that started July 3. Pt states she was constipated on July 1 and had to strained "very hard".  PT denies any rectum pain. She reports the blood is bright red and  appears after stools with wiping.    Review of Systems  Respiratory: Negative.   Genitourinary:       Blood in stool  All other systems reviewed and are negative.      Objective:   Physical Exam  Constitutional: She is oriented to person, place, and time. She appears well-developed and well-nourished. No distress.  Cardiovascular: Normal rate, regular rhythm, normal heart sounds and intact distal pulses.   No murmur heard. Pulmonary/Chest: Effort normal and breath sounds normal. No respiratory distress. She has no wheezes.  Abdominal: Soft. Bowel sounds are normal. She exhibits no distension. There is no tenderness.  Genitourinary:     Musculoskeletal: Normal range of motion. She exhibits no edema or tenderness.  Neurological: She is alert and oriented to person, place, and time. She has normal reflexes. No cranial nerve deficit.  Skin: Skin is warm and dry.  Psychiatric: She has a normal mood and affect. Her behavior is normal. Judgment and thought content normal.  Vitals reviewed.     BP 118/79   Pulse 91   Temp 97.8 F (36.6 C) (Oral)   Ht 5\' 3"  (1.6 m)   Wt 294 lb (133.4 kg)   BMI 52.08 kg/m      Assessment & Plan:  1. Hemorrhoids, unspecified hemorrhoid type Daily stool softeners Do not strain Force fluids Avoid constipation  RTO prn  - hydrocortisone (ANUSOL-HC) 25 MG suppository; Place 1 suppository (25 mg total) rectally 2 (two) times daily.  Dispense: 12 suppository; Refill: 0   Evelina Dun, FNP

## 2017-04-22 ENCOUNTER — Encounter: Payer: Self-pay | Admitting: Family Medicine

## 2017-04-22 ENCOUNTER — Ambulatory Visit (INDEPENDENT_AMBULATORY_CARE_PROVIDER_SITE_OTHER): Payer: 59 | Admitting: Family Medicine

## 2017-04-22 VITALS — BP 143/85 | HR 77 | Temp 97.2°F | Ht 63.0 in | Wt 299.0 lb

## 2017-04-22 DIAGNOSIS — B373 Candidiasis of vulva and vagina: Secondary | ICD-10-CM

## 2017-04-22 DIAGNOSIS — R300 Dysuria: Secondary | ICD-10-CM

## 2017-04-22 DIAGNOSIS — N760 Acute vaginitis: Secondary | ICD-10-CM

## 2017-04-22 DIAGNOSIS — N3000 Acute cystitis without hematuria: Secondary | ICD-10-CM | POA: Diagnosis not present

## 2017-04-22 DIAGNOSIS — B3731 Acute candidiasis of vulva and vagina: Secondary | ICD-10-CM

## 2017-04-22 DIAGNOSIS — B9689 Other specified bacterial agents as the cause of diseases classified elsewhere: Secondary | ICD-10-CM

## 2017-04-22 LAB — MICROSCOPIC EXAMINATION
Epithelial Cells (non renal): >10 /hpf — AB (ref 0–10)
RENAL EPITHEL UA: NONE SEEN /HPF

## 2017-04-22 LAB — URINALYSIS, COMPLETE
BILIRUBIN UA: NEGATIVE
GLUCOSE, UA: NEGATIVE
KETONES UA: NEGATIVE
LEUKOCYTES UA: NEGATIVE
Nitrite, UA: NEGATIVE
PH UA: 5.5 (ref 5.0–7.5)
PROTEIN UA: NEGATIVE
SPEC GRAV UA: 1.025 (ref 1.005–1.030)
UUROB: 0.2 mg/dL (ref 0.2–1.0)

## 2017-04-22 LAB — WET PREP FOR TRICH, YEAST, CLUE
CLUE CELL EXAM: POSITIVE — AB
TRICHOMONAS EXAM: NEGATIVE
YEAST EXAM: NEGATIVE

## 2017-04-22 MED ORDER — METRONIDAZOLE 500 MG PO TABS: 500.0000 mg | ORAL_TABLET | Freq: Two times a day (BID) | ORAL | 0 refills | Status: DC

## 2017-04-22 MED ORDER — FLUCONAZOLE 150 MG PO TABS: 150.0000 mg | ORAL_TABLET | Freq: Once | ORAL | 1 refills | Status: AC

## 2017-04-22 NOTE — Progress Notes (Signed)
BP (!) 162/92   Pulse 77   Temp (!) 97.2 F (36.2 C) (Oral)   Ht 5\' 3"  (1.6 m)   Wt 299 lb (135.6 kg)   BMI 52.97 kg/m    Subjective:    Patient ID: Brianna Morton, female    DOB: [birth year], 51 y.o.   MRN: [id number]  HPI: Brianna Morton is a 51 y.o. female presenting on 04/22/2017 for Urinary Tract Infection (lower abdominal pain, has started radiating into her back with pressure, decreased urination; was seen in July and treated for a UTI with Bactrim.  Finished this but did not feel UTI cleared up so was given Cipro.  Felt better for a short while but symptoms have restarted.)   HPI Vaginal discharge and dysuria Patient comes in today complaining of vaginal discharge and dysuria and urinary frequency and irritation. She was most recently a couple months ago treated for a UTI and took Bactrim which did not fully improve it and then took ciprofloxacin which did seem to fully improve it. She was feeling a lot better but then symptoms have developed again over the past week or 2. She said that she was having some pain going into her right back with pressure but does not have that currently right now here in the office. She says she's also been having decreased urination and urinary hesitancy but denies any urinary leakage. She denies any fevers or chills or abdominal pain currently but did have some over the past couple weeks as well.  Relevant past medical, surgical, family and social history reviewed and updated as indicated. Interim medical history since our last visit reviewed. Allergies and medications reviewed and updated.  Review of Systems  Constitutional: Negative for chills and fever.  Eyes: Negative for visual disturbance.  Respiratory: Negative for chest tightness and shortness of breath.   Cardiovascular: Negative for chest pain and leg swelling.  Genitourinary: Positive for dysuria and frequency. Negative for difficulty urinating.  Musculoskeletal: Negative for back  pain and gait problem.  Skin: Negative for rash.  Neurological: Negative for light-headedness and headaches.  Psychiatric/Behavioral: Negative for agitation and behavioral problems.  All other systems reviewed and are negative.   Per HPI unless specifically indicated above      Objective:    BP (!) 162/92   Pulse 77   Temp (!) 97.2 F (36.2 C) (Oral)   Ht 5\' 3"  (1.6 m)   Wt 299 lb (135.6 kg)   BMI 52.97 kg/m   Wt Readings from Last 3 Encounters:  04/22/17 299 lb (135.6 kg)  03/31/17 294 lb (133.4 kg)  02/11/17 294 lb 12.8 oz (133.7 kg)    Physical Exam  Constitutional: She is oriented to person, place, and time. She appears well-developed and well-nourished. No distress.  Eyes: Conjunctivae are normal.  Cardiovascular: Normal rate, regular rhythm, normal heart sounds and intact distal pulses.   No murmur heard. Pulmonary/Chest: Effort normal and breath sounds normal. No respiratory distress. She has no wheezes. She has no rales.  Abdominal: Soft. Bowel sounds are normal. She exhibits no distension. There is no tenderness. There is no rebound.  Genitourinary:  Genitourinary Comments: Patient declined exam  Musculoskeletal: Normal range of motion. She exhibits no edema or tenderness.  Neurological: She is alert and oriented to person, place, and time. Coordination normal.  Skin: Skin is warm and dry. No rash noted. She is not diaphoretic.  Psychiatric: She has a normal mood and affect. Her behavior is normal.  Nursing note and vitals reviewed.   Urinalysis: 0-5 WBCs, 0-2 rbc's, greater than 10 epithelial cells, moderate bacteria  Wet Prep: Positive clue cells, moderate yeast    Assessment & Plan:   Problem List Items Addressed This Visit    None    Visit Diagnoses    Dysuria    -  Primary   Relevant Orders   Urine Culture   WET PREP FOR TRICH, YEAST, CLUE   Urinalysis, Complete   Yeast vaginitis       Relevant Medications   metroNIDAZOLE (FLAGYL) 500 MG tablet    fluconazole (DIFLUCAN) 150 MG tablet   Bacterial vaginosis       Relevant Medications   metroNIDAZOLE (FLAGYL) 500 MG tablet   fluconazole (DIFLUCAN) 150 MG tablet       Follow up plan: Return if symptoms worsen or fail to improve.  Counseling provided for all of the vaccine components Orders Placed This Encounter  Procedures  . Urine Culture    Caryl Pina, MD [facility] 04/22/2017, 4:55 PM

## 2017-04-24 LAB — URINE CULTURE

## 2017-05-29 DIAGNOSIS — E7800 Pure hypercholesterolemia, unspecified: Secondary | ICD-10-CM | POA: Diagnosis not present

## 2017-05-29 DIAGNOSIS — R7303 Prediabetes: Secondary | ICD-10-CM | POA: Diagnosis not present

## 2017-05-29 DIAGNOSIS — G5712 Meralgia paresthetica, left lower limb: Secondary | ICD-10-CM | POA: Diagnosis not present

## 2017-05-29 DIAGNOSIS — R946 Abnormal results of thyroid function studies: Secondary | ICD-10-CM | POA: Diagnosis not present

## 2017-05-29 DIAGNOSIS — M545 Low back pain: Secondary | ICD-10-CM | POA: Diagnosis not present

## 2017-06-18 DIAGNOSIS — Z01419 Encounter for gynecological examination (general) (routine) without abnormal findings: Secondary | ICD-10-CM | POA: Diagnosis not present

## 2017-06-19 DIAGNOSIS — R102 Pelvic and perineal pain: Secondary | ICD-10-CM | POA: Diagnosis not present

## 2017-06-19 DIAGNOSIS — R1031 Right lower quadrant pain: Secondary | ICD-10-CM | POA: Diagnosis not present

## 2017-09-25 ENCOUNTER — Other Ambulatory Visit: Payer: Self-pay | Admitting: Gastroenterology

## 2017-09-25 DIAGNOSIS — K625 Hemorrhage of anus and rectum: Secondary | ICD-10-CM | POA: Diagnosis not present

## 2017-09-25 DIAGNOSIS — K5900 Constipation, unspecified: Secondary | ICD-10-CM | POA: Diagnosis not present

## 2017-10-17 IMAGING — DX DG HAND COMPLETE 3+V*R*
3 series · 3 of 3 positions shown · non-contrast
Comparison: No recent prior.

CLINICAL DATA: Pain.  No reported injury .  Initial evaluation .

EXAM:
RIGHT HAND - COMPLETE 3+ VIEW

[hand pa]
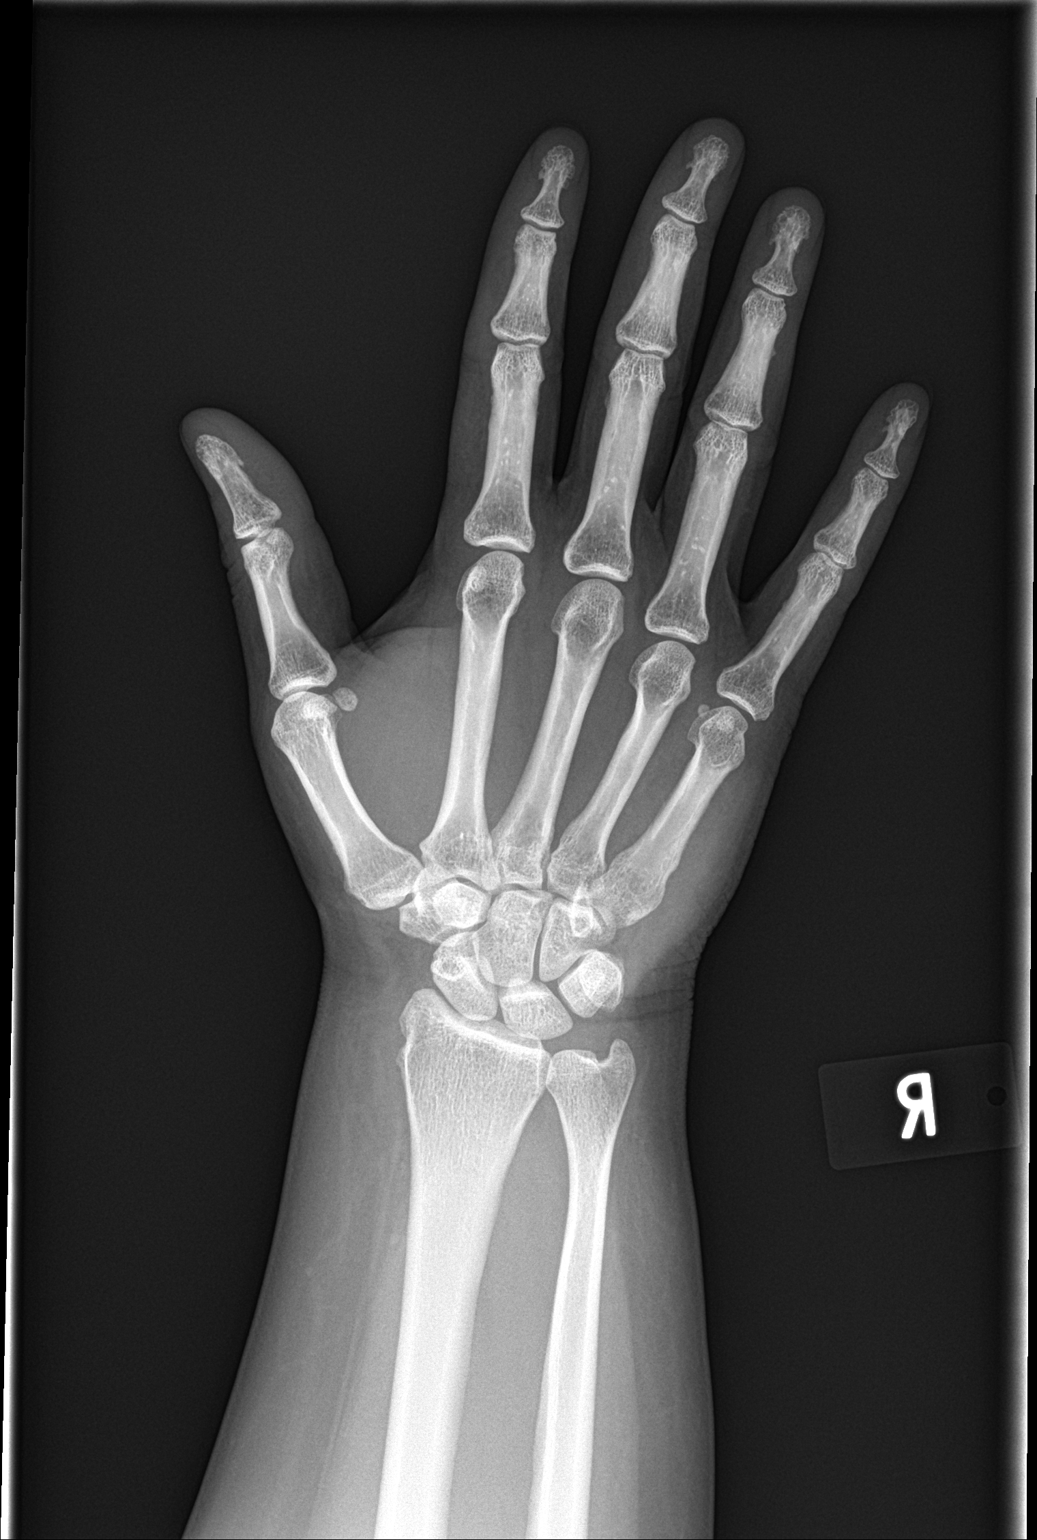

[hand obl]
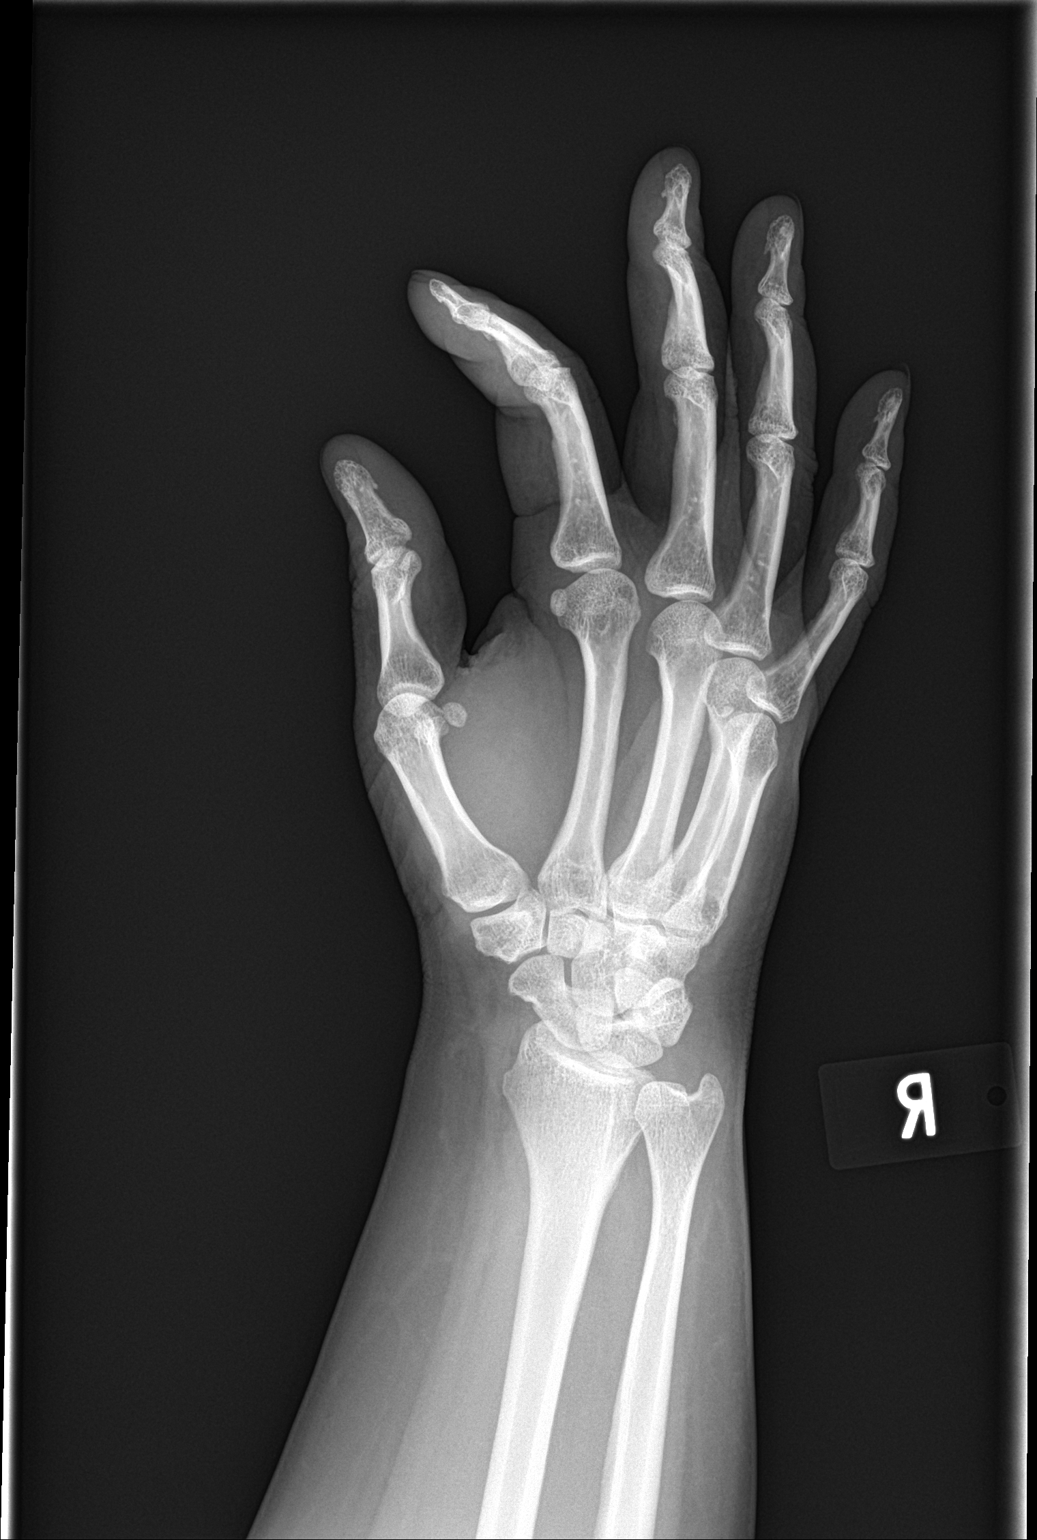

[hand lat]
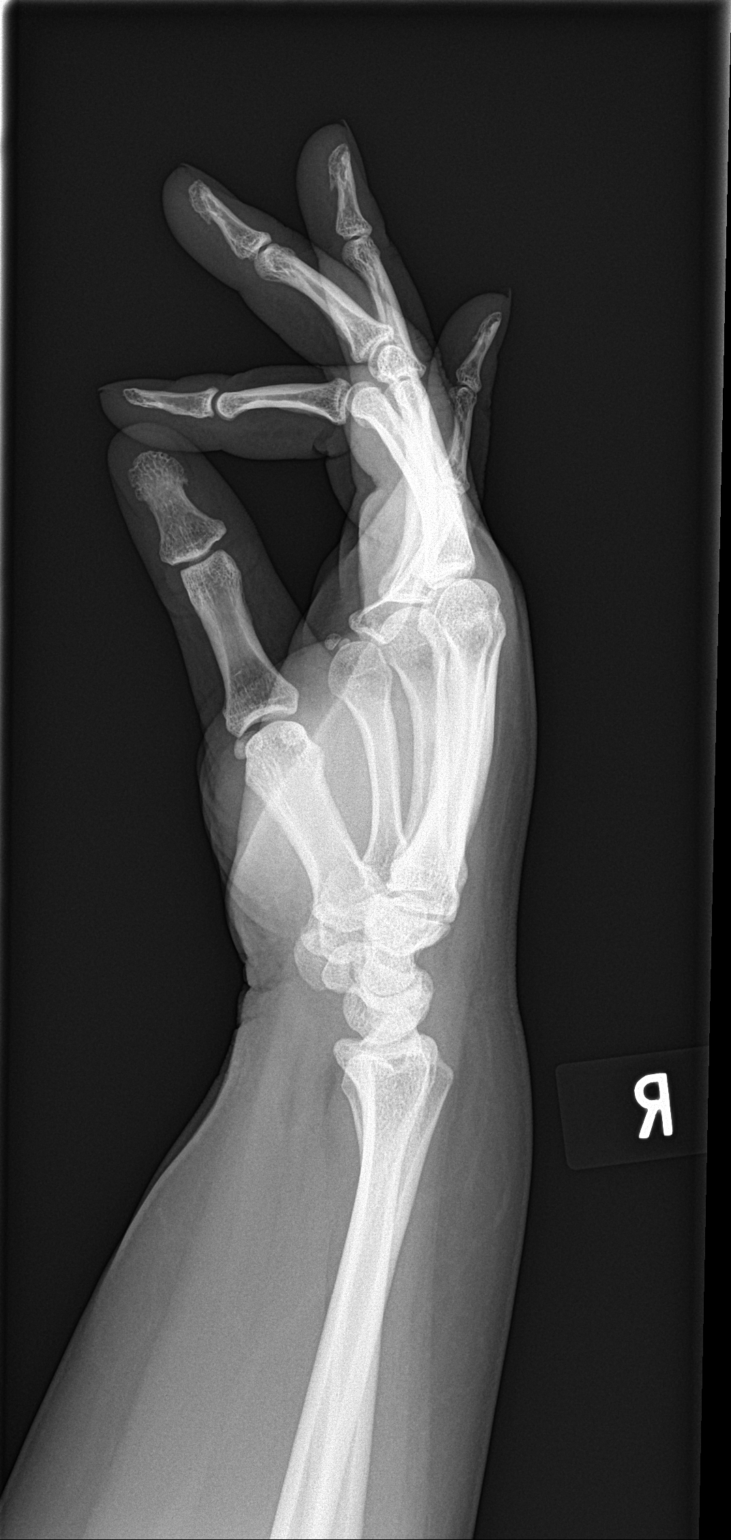

[3 of 3 positions shown; findings below may reference images not displayed]

FINDINGS: No acute bony or joint abnormality identified. No evidence of
fracture dislocation.
IMPRESSION: No acute abnormality.

## 2017-10-30 ENCOUNTER — Encounter (HOSPITAL_COMMUNITY): Payer: Self-pay | Admitting: Emergency Medicine

## 2017-10-30 ENCOUNTER — Other Ambulatory Visit: Payer: Self-pay

## 2017-11-11 ENCOUNTER — Other Ambulatory Visit: Payer: Self-pay | Admitting: Gastroenterology

## 2017-11-17 ENCOUNTER — Encounter (HOSPITAL_COMMUNITY)
Admission: RE | Disposition: A | Discharge status: Home / Self Care | Payer: Self-pay | Source: Ambulatory Visit | Attending: Gastroenterology

## 2017-11-17 ENCOUNTER — Ambulatory Visit (HOSPITAL_COMMUNITY)
Admission: RE | Admit: 2017-11-17 | Discharge: 2017-11-17 | Disposition: A | Discharge status: Home / Self Care | Payer: 59 | Source: Ambulatory Visit | Attending: Gastroenterology | Admitting: Gastroenterology

## 2017-11-17 ENCOUNTER — Encounter (HOSPITAL_COMMUNITY): Payer: Self-pay | Admitting: Emergency Medicine

## 2017-11-17 ENCOUNTER — Ambulatory Visit (HOSPITAL_COMMUNITY): Payer: 59 | Admitting: Anesthesiology

## 2017-11-17 ENCOUNTER — Other Ambulatory Visit: Payer: Self-pay

## 2017-11-17 DIAGNOSIS — Z6841 Body Mass Index (BMI) 40.0 and over, adult: Secondary | ICD-10-CM | POA: Insufficient documentation

## 2017-11-17 DIAGNOSIS — Z1211 Encounter for screening for malignant neoplasm of colon: Secondary | ICD-10-CM | POA: Diagnosis present

## 2017-11-17 DIAGNOSIS — K640 First degree hemorrhoids: Secondary | ICD-10-CM | POA: Diagnosis not present

## 2017-11-17 HISTORY — PX: COLONOSCOPY WITH PROPOFOL: SHX5780

## 2017-11-17 SURGERY — COLONOSCOPY WITH PROPOFOL
Anesthesia: Monitor Anesthesia Care

## 2017-11-17 MED ORDER — LACTATED RINGERS IV SOLN
INTRAVENOUS | Status: DC
  Administered 2017-11-17: 1000 mL via INTRAVENOUS

## 2017-11-17 MED ORDER — PROPOFOL 500 MG/50ML IV EMUL
INTRAVENOUS | Status: DC | PRN
Start: 2017-11-17 — End: 2017-11-17
  Administered 2017-11-17: 150 ug/kg/min via INTRAVENOUS

## 2017-11-17 MED ORDER — PROPOFOL 10 MG/ML IV BOLUS
INTRAVENOUS | Status: AC
  Filled 2017-11-17: qty 60

## 2017-11-17 MED ORDER — SODIUM CHLORIDE 0.9 % IV SOLN: INTRAVENOUS | Status: DC

## 2017-11-17 MED ORDER — LIDOCAINE 2% (20 MG/ML) 5 ML SYRINGE
INTRAMUSCULAR | Status: DC | PRN
  Administered 2017-11-17: 60 mg via INTRAVENOUS

## 2017-11-17 MED ORDER — PROPOFOL 10 MG/ML IV BOLUS
INTRAVENOUS | Status: AC
  Filled 2017-11-17: qty 20

## 2017-11-17 MED ORDER — PROPOFOL 10 MG/ML IV BOLUS
INTRAVENOUS | Status: DC | PRN
  Administered 2017-11-17 (×3): 20 mg via INTRAVENOUS

## 2017-11-17 SURGICAL SUPPLY — 22 items

## 2017-11-17 NOTE — H&P (Signed)
Date of Initial H&P: 11/11/17  History reviewed, patient examined, no change in status, stable for surgery.  

## 2017-11-17 NOTE — Anesthesia Procedure Notes (Signed)
Procedure Name: MAC Date/Time: 11/17/2017 9:07 AM Performed by: Dione Booze, CRNA Pre-anesthesia Checklist: Patient identified, Emergency Drugs available, Suction available and Patient being monitored Patient Re-evaluated:Patient Re-evaluated prior to induction Oxygen Delivery Method: Simple face mask Placement Confirmation: positive ETCO2

## 2017-11-17 NOTE — Transfer of Care (Signed)
Immediate Anesthesia Transfer of Care Note  Patient: Brianna Morton  Procedure(s) Performed: COLONOSCOPY WITH PROPOFOL (N/A )  Patient Location: PACU and Endoscopy Unit  Anesthesia Type:MAC  Level of Consciousness: awake and patient cooperative  Airway & Oxygen Therapy: Patient Spontanous Breathing and Patient connected to face mask oxygen  Post-op Assessment: Report given to RN and Post -op Vital signs reviewed and stable  Post vital signs: Reviewed and stable  Last Vitals:  Vitals:   11/17/17 0724  BP: 137/66  Resp: 15  Temp: 36.7 C  SpO2: 98%    Last Pain:  Vitals:   11/17/17 0724  TempSrc: Oral         Complications: No apparent anesthesia complications

## 2017-11-17 NOTE — Anesthesia Postprocedure Evaluation (Signed)
Anesthesia Post Note  Patient: Brianna Morton  Procedure(s) Performed: COLONOSCOPY WITH PROPOFOL (N/A )     Patient location during evaluation: PACU Anesthesia Type: MAC Level of consciousness: awake and alert Pain management: pain level controlled Vital Signs Assessment: post-procedure vital signs reviewed and stable Respiratory status: spontaneous breathing, nonlabored ventilation, respiratory function stable and patient connected to nasal cannula oxygen Cardiovascular status: stable and blood pressure returned to baseline Postop Assessment: no apparent nausea or vomiting Anesthetic complications: no    Last Vitals:  Vitals:   11/17/17 0940 11/17/17 0950  BP: 111/74 132/85  Pulse: 88 87  Resp: (!) 23 16  Temp: (!) 36.4 C   SpO2: 100% 98%    Last Pain:  Vitals:   11/17/17 0940  TempSrc: Oral                 Jermesha Sottile S

## 2017-11-17 NOTE — Op Note (Signed)
[facility] Patient Name: Brianna Morton Procedure Date: 11/17/2017 MRN: [id number] Attending MD: Lear Ng , MD Date of Birth: 07/11/1966 CSN: [id number] Age: 52 Admit Type: Outpatient Procedure:                Colonoscopy Indications:              Screening for colorectal malignant neoplasm, This                            is the patient's first colonoscopy Providers:                Lear Ng, MD, Carolynn Comment RN, RN,                            Tinnie Gens, Technician, Dione Booze, CRNA Referring MD:             Edwyna Shell. Jacelyn Grip, MD Medicines:                Propofol per Anesthesia, Monitored Anesthesia Care Complications:            No immediate complications. Estimated Blood Loss:     Estimated blood loss: none. Procedure:                Pre-Anesthesia Assessment:                           - Prior to the procedure, a History and Physical                            was performed, and patient medications and                            allergies were reviewed. The patient's tolerance of                            previous anesthesia was also reviewed. The risks                            and benefits of the procedure and the sedation                            options and risks were discussed with the patient.                            All questions were answered, and informed consent                            was obtained. Prior Anticoagulants: The patient has                            taken no previous anticoagulant or antiplatelet                            agents. ASA Grade Assessment: III - A patient with  severe systemic disease. After reviewing the risks                            and benefits, the patient was deemed in                            satisfactory condition to undergo the procedure.                           After obtaining informed consent, the colonoscope                            was  passed under direct vision. Throughout the                            procedure, the patient's blood pressure, pulse, and                            oxygen saturations were monitored continuously. The                            EC-[id number] ([id number]) scope was introduced through                            the anus and advanced to the the cecum, identified                            by appendiceal orifice and ileocecal valve. The                            colonoscopy was performed without difficulty. The                            patient tolerated the procedure well. The quality                            of the bowel preparation was adequate and good. The                            terminal ileum, the appendiceal orifice and the                            rectum were photographed. Scope In: 9:18:00 AM Scope Out: 9:32:15 AM Scope Withdrawal Time: 0 hours 6 minutes 58 seconds  Total Procedure Duration: 0 hours 14 minutes 15 seconds  Findings:      The perianal and digital rectal examinations were normal.      Internal hemorrhoids were found during retroflexion. The hemorrhoids       were small and Grade I (internal hemorrhoids that do not prolapse).      The exam was otherwise normal throughout the examined colon.      The terminal ileum appeared normal. Impression:               - Internal hemorrhoids.                           -  The examined portion of the ileum was normal.                           - No specimens collected. Moderate Sedation:      N/A- Per Anesthesia Care Recommendation:           - Patient has a contact number available for                            emergencies. The signs and symptoms of potential                            delayed complications were discussed with the                            patient. Return to normal activities tomorrow.                            Written discharge instructions were provided to the                            patient.                            - High fiber diet.                           - Repeat colonoscopy in 10 years for screening                            purposes.                           - Continue present medications. Procedure Code(s):        --- Professional ---                           [phone], Colonoscopy, flexible; diagnostic, including                            collection of specimen(s) by brushing or washing,                            when performed (separate procedure) Diagnosis Code(s):        --- Professional ---                           Z12.11, Encounter for screening for malignant                            neoplasm of colon                           K64.0, First degree hemorrhoids CPT copyright 2016 [employer]. All rights reserved. The codes documented in this report are preliminary and upon coder review may  be revised to meet current compliance requirements. Lear Ng, MD 11/17/2017  9:37:58 AM This report has been signed electronically. Number of Addenda: 0

## 2017-11-17 NOTE — Anesthesia Preprocedure Evaluation (Signed)
Anesthesia Evaluation  Patient identified by MRN, date of birth, ID band Patient awake    Reviewed: Allergy & Precautions, H&P , NPO status , Patient's Chart, lab work & pertinent test results  Airway Mallampati: II   Neck ROM: full    Dental   Pulmonary neg pulmonary ROS,    breath sounds clear to auscultation       Cardiovascular negative cardio ROS   Rhythm:regular Rate:Normal     Neuro/Psych    GI/Hepatic   Endo/Other  Morbid obesity  Renal/GU      Musculoskeletal   Abdominal   Peds  Hematology   Anesthesia Other Findings   Reproductive/Obstetrics                             Anesthesia Physical Anesthesia Plan  ASA: II  Anesthesia Plan: MAC   Post-op Pain Management:    Induction: Intravenous  PONV Risk Score and Plan: 2 and Propofol infusion and Treatment may vary due to age or medical condition  Airway Management Planned: Simple Face Mask  Additional Equipment:   Intra-op Plan:   Post-operative Plan:   Informed Consent: I have reviewed the patients History and Physical, chart, labs and discussed the procedure including the risks, benefits and alternatives for the proposed anesthesia with the patient or authorized representative who has indicated his/her understanding and acceptance.     Plan Discussed with: CRNA, Anesthesiologist and Surgeon  Anesthesia Plan Comments:         Anesthesia Quick Evaluation

## 2017-11-17 NOTE — Discharge Instructions (Signed)

## 2017-11-17 NOTE — Interval H&P Note (Signed)
History and Physical Interval Note:  11/17/2017 8:19 AM  Brianna Morton  has presented today for surgery, with the diagnosis of screening  The various methods of treatment have been discussed with the patient and family. After consideration of risks, benefits and other options for treatment, the patient has consented to  Procedure(s): COLONOSCOPY WITH PROPOFOL (N/A) as a surgical intervention .  The patient's history has been reviewed, patient examined, no change in status, stable for surgery.  I have reviewed the patient's chart and labs.  Questions were answered to the patient's satisfaction.     [place] C.

## 2017-11-18 ENCOUNTER — Encounter (HOSPITAL_COMMUNITY): Payer: Self-pay | Admitting: Gastroenterology

## 2017-12-02 ENCOUNTER — Ambulatory Visit: Payer: 59 | Admitting: Family

## 2018-08-03 DIAGNOSIS — Z01419 Encounter for gynecological examination (general) (routine) without abnormal findings: Secondary | ICD-10-CM | POA: Diagnosis not present

## 2018-08-03 DIAGNOSIS — Z6841 Body Mass Index (BMI) 40.0 and over, adult: Secondary | ICD-10-CM | POA: Diagnosis not present

## 2018-12-30 ENCOUNTER — Other Ambulatory Visit: Payer: Self-pay

## 2018-12-30 ENCOUNTER — Ambulatory Visit (INDEPENDENT_AMBULATORY_CARE_PROVIDER_SITE_OTHER): Payer: 59 | Admitting: Nurse Practitioner

## 2018-12-30 ENCOUNTER — Encounter: Payer: Self-pay | Admitting: Nurse Practitioner

## 2018-12-30 DIAGNOSIS — H8303 Labyrinthitis, bilateral: Secondary | ICD-10-CM

## 2018-12-30 MED ORDER — MECLIZINE HCL 25 MG PO TABS: 25.0000 mg | ORAL_TABLET | Freq: Three times a day (TID) | ORAL | 0 refills | Status: DC | PRN

## 2018-12-30 NOTE — Progress Notes (Signed)
Patient ID: Brianna Morton, female   DOB: 1966/05/06, 53 y.o.   MRN: [id number]    Virtual Visit via telephone Note  I connected with Brianna Morton on 12/30/18 at 2:45 PM by telephone and verified that I am speaking with the correct person using two identifiers. KAHMARI HERARD is currently located at home and no one is currently with her during visit. The provider, Mary-Margaret Hassell Done, FNP is located in their office at time of visit.  I discussed the limitations, risks, security and privacy concerns of performing an evaluation and management service by telephone and the availability of in person appointments. I also discussed with the patient that there may be a patient responsible charge related to this service. The patient expressed understanding and agreed to proceed.   History and Present Illness:  Patient calls in stating that she got up at work last night and she felt like she was leaning to the left. Only last a few minutes. She then got home from work and felt a little dizzy. She says she still does not feel right this morning. Her blood pressure was 135/88.  She says she feels that her head is just not right.  "feels full".     Review of Systems  Constitutional: Negative for chills and fever.  HENT: Negative for ear discharge, ear pain and tinnitus.   Respiratory: Negative.   Cardiovascular: Negative.   Genitourinary: Negative.   Skin: Negative.   Neurological: Positive for dizziness.  Psychiatric/Behavioral: Negative.   All other systems reviewed and are negative.    Observations/Objective: Alert and oriented  Assessment and Plan: Brianna Morton in today with chief complaint of No chief complaint on file.   1. Labyrinthitis of both ears Meds ordered this encounter  Medications  . meclizine (ANTIVERT) 25 MG tablet    Sig: Take 1 tablet (25 mg total) by mouth 3 (three) times daily as needed for dizziness.    Dispense:  30 tablet    Refill:  0    Order  Specific Question:   Supervising Provider    Answer:   Caryl Pina A [[id number]]   flonase nasal spray OTC Force fluids  Follow Up Instructions: prn    I discussed the assessment and treatment plan with the patient. The patient was provided an opportunity to ask questions and all were answered. The patient agreed with the plan and demonstrated an understanding of the instructions.   The patient was advised to call back or seek an in-person evaluation if the symptoms worsen or if the condition fails to improve as anticipated.  The above assessment and management plan was discussed with the patient. The patient verbalized understanding of and has agreed to the management plan. Patient is aware to call the clinic if symptoms persist or worsen. Patient is aware when to return to the clinic for a follow-up visit. Patient educated on when it is appropriate to go to the emergency department.    I provided 8 minutes of non-face-to-face time during this encounter.    Mary-Margaret Hassell Done, FNP

## 2019-08-04 ENCOUNTER — Encounter: Payer: Self-pay | Admitting: Family Medicine

## 2019-08-04 ENCOUNTER — Ambulatory Visit (INDEPENDENT_AMBULATORY_CARE_PROVIDER_SITE_OTHER): Payer: Self-pay | Admitting: Family Medicine

## 2019-08-04 DIAGNOSIS — L0291 Cutaneous abscess, unspecified: Secondary | ICD-10-CM

## 2019-08-04 DIAGNOSIS — Z8742 Personal history of other diseases of the female genital tract: Secondary | ICD-10-CM

## 2019-08-04 MED ORDER — FLUCONAZOLE 150 MG PO TABS: ORAL_TABLET | ORAL | 0 refills | Status: DC

## 2019-08-04 MED ORDER — SULFAMETHOXAZOLE-TRIMETHOPRIM 800-160 MG PO TABS: 1.0000 | ORAL_TABLET | Freq: Two times a day (BID) | ORAL | 0 refills | Status: AC

## 2019-08-04 NOTE — Progress Notes (Signed)
Virtual Visit via telephone Note Due to COVID-19 pandemic this visit was conducted virtually. This visit type was conducted due to national recommendations for restrictions regarding the COVID-19 Pandemic (e.g. social distancing, sheltering in place) in an effort to limit this patient's exposure and mitigate transmission in our community. All issues noted in this document were discussed and addressed.  A physical exam was not performed with this format.   I connected with Brianna Morton on 08/04/2019 at 1100 by telephone and verified that I am speaking with the correct person using two identifiers. Brianna Morton is currently located at home and no one is currently with them during visit. The provider, Monia Pouch, FNP is located in their office at time of visit.  I discussed the limitations, risks, security and privacy concerns of performing an evaluation and management service by telephone and the availability of in person appointments. I also discussed with the patient that there may be a patient responsible charge related to this service. The patient expressed understanding and agreed to proceed.  Subjective:  Patient ID: Brianna Morton, female    DOB: 1966/08/01, 53 y.o.   MRN: [id number]  Chief Complaint:  Abscess   HPI: Brianna Morton is a 53 y.o. female presenting on 08/04/2019 for Abscess   Pt reports she noticed a bump to her lower abdomen several days ago. States she has tried to squeeze the bump but is unable to express any drainage. States the are is now getting larger and is erythematous. States it is tender to touch. She has been using warm compresses without relief of symptoms. No fever, chills, weakness, or confusion.   Abscess This is a new problem. The current episode started 1 to 4 weeks ago. Associated symptoms include a rash. Pertinent negatives include no abdominal pain, anorexia, arthralgias, change in bowel habit, chest pain, chills, congestion, coughing,  diaphoresis, fatigue, fever, headaches, joint swelling, myalgias, nausea, neck pain, numbness, sore throat, swollen glands, urinary symptoms, vertigo, visual change, vomiting or weakness. She has tried heat for the symptoms. The treatment provided no relief.     Relevant past medical, surgical, family, and social history reviewed and updated as indicated.  Allergies and medications reviewed and updated.   History reviewed. No pertinent past medical history.  Past Surgical History:  Procedure Laterality Date  . COLONOSCOPY WITH PROPOFOL N/A 11/17/2017   Procedure: COLONOSCOPY WITH PROPOFOL;  Surgeon: Wilford Corner, MD;  Location: [facility];  Service: Endoscopy;  Laterality: N/A;    Social History   Socioeconomic History  . Marital status: Single    Spouse name: Not on file  . Number of children: Not on file  . Years of education: Not on file  . Highest education level: Not on file  Occupational History  . Not on file  Social Needs  . Financial resource strain: Not on file  . Food insecurity    Worry: Not on file    Inability: Not on file  . Transportation needs    Medical: Not on file    Non-medical: Not on file  Tobacco Use  . Smoking status: Never Smoker  . Smokeless tobacco: Never Used  Substance and Sexual Activity  . Alcohol use: No    Alcohol/week: 0.0 standard drinks  . Drug use: No  . Sexual activity: Not on file  Lifestyle  . Physical activity    Days per week: Not on file    Minutes per session: Not on file  . Stress: Not  on file  Relationships  . Social Herbalist on phone: Not on file    Gets together: Not on file    Attends religious service: Not on file    Active member of club or organization: Not on file    Attends meetings of clubs or organizations: Not on file    Relationship status: Not on file  . Intimate partner violence    Fear of current or ex partner: Not on file    Emotionally abused: Not on file    Physically abused:  Not on file    Forced sexual activity: Not on file  Other Topics Concern  . Not on file  Social History Narrative  . Not on file    Outpatient Encounter Medications as of 08/04/2019  Medication Sig  . fluconazole (DIFLUCAN) 150 MG tablet 1 po q week x 4 weeks  . hydrocortisone (ANUSOL-HC) 25 MG suppository Place 1 suppository (25 mg total) rectally 2 (two) times daily. (Patient not taking: Reported on 04/22/2017)  . loratadine (CLARITIN) 10 MG tablet Take 10 mg by mouth daily as needed for allergies.  Marland Kitchen meclizine (ANTIVERT) 25 MG tablet Take 1 tablet (25 mg total) by mouth 3 (three) times daily as needed for dizziness.  . Multiple Vitamin (MULTIVITAMIN WITH MINERALS) TABS tablet Take 1 tablet by mouth 4 (four) times a week.  . sulfamethoxazole-trimethoprim (BACTRIM DS) 800-160 MG tablet Take 1 tablet by mouth 2 (two) times daily for 10 days.   No facility-administered encounter medications on file as of 08/04/2019.     No Known Allergies  Review of Systems  Constitutional: Negative for activity change, appetite change, chills, diaphoresis, fatigue, fever and unexpected weight change.  HENT: Negative.  Negative for congestion and sore throat.   Eyes: Negative.   Respiratory: Negative for cough, chest tightness and shortness of breath.   Cardiovascular: Negative for chest pain, palpitations and leg swelling.  Gastrointestinal: Negative for abdominal pain, anorexia, blood in stool, change in bowel habit, constipation, diarrhea, nausea and vomiting.  Endocrine: Negative.   Genitourinary: Negative for decreased urine volume, difficulty urinating, dysuria, frequency and urgency.  Musculoskeletal: Negative for arthralgias, joint swelling, myalgias and neck pain.  Skin: Positive for color change and rash. Negative for pallor and wound.  Allergic/Immunologic: Negative.   Neurological: Negative for dizziness, vertigo, weakness, numbness and headaches.  Hematological: Negative.    Psychiatric/Behavioral: Negative for confusion, hallucinations, sleep disturbance and suicidal ideas.  All other systems reviewed and are negative.        Observations/Objective: No vital signs or physical exam, this was a telephone or virtual health encounter.  Pt alert and oriented, answers all questions appropriately, and able to speak in full sentences.    Assessment and Plan: Seng was seen today for abscess.  Diagnoses and all orders for this visit:  Abscess Reported symptoms consistent with abscess. Symptomatic care discussed in detail. Warm compresses. Do not squeeze or pick at abscess. Medications as prescribed. Report any new, worsening, or persistent symptoms.  -     sulfamethoxazole-trimethoprim (BACTRIM DS) 800-160 MG tablet; Take 1 tablet by mouth 2 (two) times daily for 10 days.  History of vulvovaginitis -     fluconazole (DIFLUCAN) 150 MG tablet; 1 po q week x 4 weeks     Follow Up Instructions: Return if symptoms worsen or fail to improve.    I discussed the assessment and treatment plan with the patient. The patient was provided an opportunity to ask questions  and all were answered. The patient agreed with the plan and demonstrated an understanding of the instructions.   The patient was advised to call back or seek an in-person evaluation if the symptoms worsen or if the condition fails to improve as anticipated.  The above assessment and management plan was discussed with the patient. The patient verbalized understanding of and has agreed to the management plan. Patient is aware to call the clinic if they develop any new symptoms or if symptoms persist or worsen. Patient is aware when to return to the clinic for a follow-up visit. Patient educated on when it is appropriate to go to the emergency department.    I provided 15 minutes of non-face-to-face time during this encounter. The call started at 1100. The call ended at 1115. The other time was used  for coordination of care.    Monia Pouch, FNP-C [facility] [address] [place], [place] [address] [phone] 08/04/2019

## 2019-12-15 ENCOUNTER — Encounter: Payer: Self-pay | Admitting: Podiatry

## 2019-12-15 ENCOUNTER — Telehealth: Payer: Self-pay | Admitting: *Deleted

## 2019-12-15 ENCOUNTER — Ambulatory Visit (INDEPENDENT_AMBULATORY_CARE_PROVIDER_SITE_OTHER): Payer: 59 | Admitting: Podiatry

## 2019-12-15 ENCOUNTER — Other Ambulatory Visit: Payer: Self-pay

## 2019-12-15 ENCOUNTER — Ambulatory Visit (INDEPENDENT_AMBULATORY_CARE_PROVIDER_SITE_OTHER): Payer: 59

## 2019-12-15 VITALS — BP 147/82 | HR 80 | Temp 97.9°F

## 2019-12-15 DIAGNOSIS — M778 Other enthesopathies, not elsewhere classified: Secondary | ICD-10-CM | POA: Diagnosis not present

## 2019-12-15 MED ORDER — MELOXICAM 15 MG PO TABS: 15.0000 mg | ORAL_TABLET | Freq: Every day | ORAL | 3 refills | Status: DC

## 2019-12-15 MED ORDER — METHYLPREDNISOLONE 4 MG PO TBPK: ORAL_TABLET | ORAL | 0 refills | Status: DC

## 2019-12-15 NOTE — Telephone Encounter (Signed)
Pt called states she had a 2:30pm appt today and she wanted to know if Dr.Hyatt was going to send in something for the bacteria on the outside of her foot, she had spoken to his nurse about the fungus on the foot.

## 2019-12-15 NOTE — Progress Notes (Signed)
  Subjective:  Patient ID: Brianna Morton, female    DOB: 1966/08/05,  MRN: [id number] HPI Chief Complaint  Patient presents with  . Foot Pain    Anterior ankle/medial foot/dorsal forefoot left - aching, swelling x few months, started new job with increased walking, Rx'd prednisone by Ortho-helped while taking  . New Patient (Initial Visit)    54 y.o. female presents with the above complaint.   ROS: Denies fever chills nausea vomiting muscle aches pains calf pain back pain chest pain shortness of breath.  No past medical history on file. Past Surgical History:  Procedure Laterality Date  . COLONOSCOPY WITH PROPOFOL N/A 11/17/2017   Procedure: COLONOSCOPY WITH PROPOFOL;  Surgeon: Wilford Corner, MD;  Location: [facility];  Service: Endoscopy;  Laterality: N/A;    Current Outpatient Medications:  .  Acetaminophen (TYLENOL ARTHRITIS PAIN PO), Take by mouth., Disp: , Rfl:  .  meloxicam (MOBIC) 15 MG tablet, Take 1 tablet (15 mg total) by mouth daily., Disp: 30 tablet, Rfl: 3 .  methylPREDNISolone (MEDROL DOSEPAK) 4 MG TBPK tablet, 6 day dose pack - take as directed, Disp: 21 tablet, Rfl: 0  No Known Allergies Review of Systems Objective:   Vitals:   12/15/19 1454  BP: (!) 147/82  Pulse: 80  Temp: 97.9 F (36.6 C)    General: Well developed, nourished, in no acute distress, alert and oriented x3   Dermatological: Skin is warm, dry and supple bilateral. Nails x 10 are well maintained; remaining integument appears unremarkable at this time. There are no open sores, no preulcerative lesions, no rash or signs of infection present.  Vascular: Dorsalis Pedis artery and Posterior Tibial artery pedal pulses are 2/4 bilateral with immedate capillary fill time. Pedal hair growth present. No varicosities and no lower extremity edema present bilateral.   Neruologic: Grossly intact via light touch bilateral. Vibratory intact via tuning fork bilateral. Protective threshold with Semmes  Wienstein monofilament intact to all pedal sites bilateral. Patellar and Achilles deep tendon reflexes 2+ bilateral. No Babinski or clonus noted bilateral.   Musculoskeletal: No gross boney pedal deformities bilateral. No pain, crepitus, or limitation noted with foot and ankle range of motion bilateral. Muscular strength 5/5 in all groups tested bilateral.  She has pain on palpation to the proximal dorsal aspect of the foot.  She has pain on frontal plane range of motion at the level of the midtarsal joint.  The visible radiograph the makes me concerned of fractures or osteoarthritic change.  Gait: Unassisted, Nonantalgic.    Radiographs:  Radiographs demonstrate an osseously mature individual with mild pes planus.  Plantar distally oriented calcaneal heel spur soft tissue increase in density plantar fascial K insertion site.  Small amount of fluid within the anterior ankle I see no acute findings at this point.  Assessment & Plan:   Assessment: Capsulitis proximal dorsal foot.    Plan: Injected 20 mg Kenalog 5 mg Marcaine to the dorsal aspect of the foot point maximal tenderness left.  Tolerated procedure well without complications.  Start her Medrol Dosepak to be followed by meloxicam.  And I will follow-up with her in a few weeks.     Hulan Szumski T. [place], [place]

## 2020-01-02 ENCOUNTER — Other Ambulatory Visit: Payer: 59 | Admitting: Orthotics

## 2020-01-16 ENCOUNTER — Other Ambulatory Visit: Payer: Self-pay

## 2020-01-16 ENCOUNTER — Ambulatory Visit (INDEPENDENT_AMBULATORY_CARE_PROVIDER_SITE_OTHER): Payer: 59 | Admitting: Orthotics

## 2020-01-16 DIAGNOSIS — M778 Other enthesopathies, not elsewhere classified: Secondary | ICD-10-CM | POA: Diagnosis not present

## 2020-01-16 NOTE — Progress Notes (Signed)
Patient came into today to be cast for Custom Foot Orthotics. Upon recommendation of Dr. Milinda Pointer Patient presents with mid foot capsulitis Goals are RF stability, long arch support, forefoot cushion Plan vendor

## 2020-02-06 ENCOUNTER — Other Ambulatory Visit: Payer: Self-pay

## 2020-02-06 ENCOUNTER — Ambulatory Visit: Payer: 59 | Admitting: Orthotics

## 2020-02-06 DIAGNOSIS — M778 Other enthesopathies, not elsewhere classified: Secondary | ICD-10-CM

## 2020-02-06 NOTE — Progress Notes (Signed)
Patient came in today to pick up custom made foot orthotics.  The goals were accomplished and the patient reported no dissatisfaction with said orthotics.  Patient was advised of breakin period and how to report any issues. 

## 2020-05-22 ENCOUNTER — Ambulatory Visit: Payer: 59 | Admitting: Podiatry

## 2020-06-17 ENCOUNTER — Other Ambulatory Visit: Payer: Self-pay | Admitting: Podiatry

## 2020-06-19 ENCOUNTER — Ambulatory Visit: Payer: 59 | Admitting: Podiatry

## 2020-07-05 ENCOUNTER — Encounter: Payer: Self-pay | Admitting: Podiatry

## 2020-07-05 ENCOUNTER — Other Ambulatory Visit: Payer: Self-pay

## 2020-07-05 ENCOUNTER — Ambulatory Visit (INDEPENDENT_AMBULATORY_CARE_PROVIDER_SITE_OTHER): Payer: 59 | Admitting: Podiatry

## 2020-07-05 DIAGNOSIS — M778 Other enthesopathies, not elsewhere classified: Secondary | ICD-10-CM

## 2020-07-05 NOTE — Progress Notes (Signed)
She presents today for follow-up of capsulitis left foot.  States that he never really got better but he said that he may need more than 1 shot.  Objective: Vital signs are stable alert oriented x3.  Pulses are palpable.  She now has pain around the ankle joint however she still has pain across the dorsum of the foot left.  Assessment: Resolving ankle capsulitis left she now has capsulitis tarsometatarsal joints left.  Plan: Discussed etiology pathology conservative therapies went ahead and injected the left dorsal aspect of the foot today she tolerated procedure well we just discussed appropriate shoe gear stretching exercise ice therapy and shoe gear modifications.  I will follow-up with her in 1 month

## 2020-08-23 ENCOUNTER — Ambulatory Visit: Payer: 59 | Admitting: Podiatry

## 2020-09-25 ENCOUNTER — Ambulatory Visit: Payer: 59 | Admitting: Podiatry

## 2020-10-24 ENCOUNTER — Other Ambulatory Visit: Payer: Self-pay | Admitting: Podiatry

## 2020-10-24 NOTE — Telephone Encounter (Signed)
Please advise 

## 2021-03-08 ENCOUNTER — Other Ambulatory Visit: Payer: Self-pay | Admitting: Podiatry

## 2021-03-08 NOTE — Telephone Encounter (Signed)
Please advise 

## 2021-05-09 NOTE — Progress Notes (Signed)
Pt. Needs orders for upcoming procedure.PAT and labs 05/10/21.

## 2021-05-09 NOTE — Patient Instructions (Signed)
DUE TO COVID-19 ONLY ONE VISITOR IS ALLOWED TO COME WITH YOU AND STAY IN THE WAITING ROOM ONLY DURING PRE OP AND PROCEDURE DAY OF SURGERY. THE 1 VISITOR  MAY VISIT WITH YOU AFTER SURGERY IN YOUR PRIVATE ROOM DURING VISITING HOURS ONLY!               Brianna Morton   Your procedure is scheduled on: 05/15/21   Report to [facility] Main  Entrance   Report to short stay at : 5:15 AM     Call this number if you have problems the morning of surgery [phone]    Remember: Do not eat food or drink liquids :After Midnight.   BRUSH YOUR TEETH MORNING OF SURGERY AND RINSE YOUR MOUTH OUT, NO CHEWING GUM CANDY OR MINTS.    Take these medicines the morning of surgery with A SIP OF WATER: N/A                               You may not have any metal on your body including hair pins and              piercings  Do not wear jewelry, make-up, lotions, powders or perfumes, deodorant             Do not wear nail polish on your fingernails.  Do not shave  48 hours prior to surgery.    Do not bring valuables to the hospital. Brianna Morton.  Contacts, dentures or bridgework may not be worn into surgery.  Leave suitcase in the car. After surgery it may be brought to your room.     Patients discharged the day of surgery will not be allowed to drive home. IF YOU ARE HAVING SURGERY AND GOING HOME THE SAME DAY, YOU MUST HAVE AN ADULT TO DRIVE YOU HOME AND BE WITH YOU FOR 24 HOURS. YOU MAY GO HOME BY TAXI OR UBER OR ORTHERWISE, BUT AN ADULT MUST ACCOMPANY YOU HOME AND STAY WITH YOU FOR 24 HOURS.  Name and phone number of your driver:  Special Instructions: N/A              Please read over the following fact sheets you were given: _____________________________________________________________________           [facility] - Preparing for Surgery Before surgery, you can play an important role.  Because skin is not sterile, your skin needs to be as free  of germs as possible.  You can reduce the number of germs on your skin by washing with CHG (chlorahexidine gluconate) soap before surgery.  CHG is an antiseptic cleaner which kills germs and bonds with the skin to continue killing germs even after washing. Please DO NOT use if you have an allergy to CHG or antibacterial soaps.  If your skin becomes reddened/irritated stop using the CHG and inform your nurse when you arrive at Short Stay. Do not shave (including legs and underarms) for at least 48 hours prior to the first CHG shower.  You may shave your face/neck. Please follow these instructions carefully:  1.  Shower with CHG Soap the night before surgery and the  morning of Surgery.  2.  If you choose to wash your hair, wash your hair first as usual with your  normal  shampoo.  3.  After you shampoo, rinse your hair and body thoroughly to remove the  shampoo.                           4.  Use CHG as you would any other liquid soap.  You can apply chg directly  to the skin and wash                       Gently with a scrungie or clean washcloth.  5.  Apply the CHG Soap to your body ONLY FROM THE NECK DOWN.   Do not use on face/ open                           Wound or open sores. Avoid contact with eyes, ears mouth and genitals (private parts).                       Wash face,  Genitals (private parts) with your normal soap.             6.  Wash thoroughly, paying special attention to the area where your surgery  will be performed.  7.  Thoroughly rinse your body with warm water from the neck down.  8.  DO NOT shower/wash with your normal soap after using and rinsing off  the CHG Soap.                9.  Pat yourself dry with a clean towel.            10.  Wear clean pajamas.            11.  Place clean sheets on your bed the night of your first shower and do not  sleep with pets. Day of Surgery : Do not apply any lotions/deodorants the morning of surgery.  Please wear clean clothes to the  hospital/surgery center.  FAILURE TO FOLLOW THESE INSTRUCTIONS MAY RESULT IN THE CANCELLATION OF YOUR SURGERY PATIENT SIGNATURE_________________________________  NURSE SIGNATURE__________________________________  ________________________________________________________________________

## 2021-05-10 ENCOUNTER — Encounter (HOSPITAL_COMMUNITY): Payer: Self-pay

## 2021-05-10 ENCOUNTER — Other Ambulatory Visit: Payer: Self-pay

## 2021-05-10 ENCOUNTER — Encounter (HOSPITAL_COMMUNITY)
Admission: RE | Admit: 2021-05-10 | Discharge: 2021-05-10 | Disposition: A | Discharge status: Home / Self Care | Payer: 59 | Source: Ambulatory Visit | Attending: Obstetrics and Gynecology | Admitting: Obstetrics and Gynecology

## 2021-05-10 DIAGNOSIS — Z01812 Encounter for preprocedural laboratory examination: Secondary | ICD-10-CM | POA: Insufficient documentation

## 2021-05-10 HISTORY — DX: Anemia, unspecified: D64.9

## 2021-05-10 HISTORY — DX: Unspecified osteoarthritis, unspecified site: M19.90

## 2021-05-10 HISTORY — DX: Prediabetes: R73.03

## 2021-05-10 LAB — CBC
HCT: 40 % (ref 36.0–46.0)
Hemoglobin: 12.8 g/dL (ref 12.0–15.0)
MCH: 27.5 pg (ref 26.0–34.0)
MCHC: 32 g/dL (ref 30.0–36.0)
MCV: 86 fL (ref 80.0–100.0)
Platelets: 309 10*3/uL (ref 150–400)
RBC: 4.65 MIL/uL (ref 3.87–5.11)
RDW: 14.8 % (ref 11.5–15.5)
WBC: 6.8 10*3/uL (ref 4.0–10.5)
nRBC: 0 % (ref 0.0–0.2)

## 2021-05-10 LAB — HEMOGLOBIN A1C
Hgb A1c MFr Bld: 5.7 % — ABNORMAL HIGH (ref 4.8–5.6)
Mean Plasma Glucose: 116.89 mg/dL

## 2021-05-10 NOTE — H&P (Signed)
NAME: Brianna Morton, Brianna Morton MEDICAL RECORD NO: [id number] ACCOUNT NO: [id number] DATE OF BIRTH: July 03, 1966 PHYSICIAN: Ralene Bathe. Matthew Saras, MD  History and Physical   CHIEF COMPLAINT:  Menorrhagia.  HISTORY OF PRESENT ILLNESS:  This 55 year old perimenopausal G1, P1, was seen recently with complaints of postmenopausal spotting.  Her [place] on 04/26/2021 was 50.3.  SHT to be performed in our office demonstrated a 5.2 subserosal fibroid and another that  was 4.8, on saline infusion.  Also had a 2.5 cm density that appeared more polypoid, but could be a submucous fibroid.  Presents at this time for hysteroscopy with MyoSure resection.  This procedure including specific risks regarding bleeding, infection,  other complications such as perforation that may require additional surgery along with her expected recovery time reviewed with her.  PAST MEDICAL HISTORY: ALLERGIES:  None.  MEDICATIONS:  Meloxicam 15 mg daily, nystatin/TAC cream b.i.d. as needed.  PAST SURGICAL HISTORY:  Prior D and C x2.  SOCIAL HISTORY:  She is single, nonsmoker, does not consume alcohol.  FAMILY HISTORY:  Two aunts that have had breast cancer.  Mother with hypertension, father with heart disease.  PHYSICAL EXAMINATION: VITAL SIGNS:  Temperature 98.2, blood pressure 134/84, BMI is 54, weight is 318. HEENT:  Unremarkable. NECK:  Supple, without masses, thyroid nonpalpable. HEART:  Clear. LUNGS:  Clear. BREASTS:  Negative. ABDOMEN:  Soft, flat, nontender. PELVIC:  Vulva, vagina, cervix normal.  Uterus upper limit of normal size.  Adnexa negative.  Exam difficult due to her weight. EXTREMITIES:  Unremarkable. NEUROLOGIC:  Unremarkable.  IMPRESSION:  Perimenopausal bleeding, endometrial polyp versus submucous fibroid noted on saline ultrasound.  PLAN:  Hysteroscopy, D and C with MyoSure resection of the polyp, procedure and risks reviewed as above.   SHW D: 05/09/2021 9:02:36 am T: 05/09/2021 9:33:00 am  JOB:  [id number]

## 2021-05-10 NOTE — Progress Notes (Addendum)
COVID Vaccine Completed: NO Date COVID Vaccine completed: COVID vaccine manufacturer: Pfizer    Moderna   [employer]   PCP - Dr. Yaakov Guthrie Cardiologist - No  Chest x-ray -  EKG -  Stress Test -  ECHO -  Cardiac Cath -  Pacemaker/ICD device last checked:  Sleep Study -  CPAP -   Fasting Blood Sugar -  Checks Blood Sugar _____ times a day  Blood Thinner Instructions: Aspirin Instructions: Last Dose:  Anesthesia review:   Patient denies shortness of breath, fever, cough and chest pain at PAT appointment   Patient verbalized understanding of instructions that were given to them at the PAT appointment. Patient was also instructed that they will need to review over the PAT instructions again at home before surgery.

## 2021-05-11 LAB — RPR: RPR Ser Ql: NONREACTIVE

## 2021-05-14 NOTE — Anesthesia Preprocedure Evaluation (Addendum)
Anesthesia Evaluation  Patient identified by MRN, date of birth, ID band Patient awake    Reviewed: Allergy & Precautions, NPO status , Patient's Chart, lab work & pertinent test results  History of Anesthesia Complications Negative for: history of anesthetic complications  Airway Mallampati: III  TM Distance: >3 FB Neck ROM: Full    Dental no notable dental hx.    Pulmonary neg pulmonary ROS,    Pulmonary exam normal        Cardiovascular negative cardio ROS Normal cardiovascular exam     Neuro/Psych negative neurological ROS  negative psych ROS   GI/Hepatic negative GI ROS, Neg liver ROS,   Endo/Other  Morbid obesity (BMI 54)  Renal/GU negative Renal ROS  negative genitourinary   Musculoskeletal  (+) Arthritis ,   Abdominal   Peds  Hematology negative hematology ROS (+)   Anesthesia Other Findings Day of surgery medications reviewed with patient.  Reproductive/Obstetrics PMB                            Anesthesia Physical Anesthesia Plan  ASA: 3  Anesthesia Plan: General   Post-op Pain Management:    Induction: Intravenous  PONV Risk Score and Plan: 3 and Treatment may vary due to age or medical condition, Midazolam, Ondansetron, Dexamethasone and Scopolamine patch - Pre-op  Airway Management Planned: Oral ETT  Additional Equipment: None  Intra-op Plan:   Post-operative Plan: Extubation in OR  Informed Consent: I have reviewed the patients History and Physical, chart, labs and discussed the procedure including the risks, benefits and alternatives for the proposed anesthesia with the patient or authorized representative who has indicated his/her understanding and acceptance.     Dental advisory given  Plan Discussed with: CRNA  Anesthesia Plan Comments:      Anesthesia Quick Evaluation

## 2021-05-15 ENCOUNTER — Ambulatory Visit (HOSPITAL_COMMUNITY)
Admission: RE | Admit: 2021-05-15 | Discharge: 2021-05-15 | Disposition: A | Discharge status: Home / Self Care | Payer: 59 | Attending: Obstetrics and Gynecology | Admitting: Obstetrics and Gynecology

## 2021-05-15 ENCOUNTER — Encounter (HOSPITAL_COMMUNITY): Payer: Self-pay | Admitting: Obstetrics and Gynecology

## 2021-05-15 ENCOUNTER — Encounter (HOSPITAL_COMMUNITY)
Admission: RE | Disposition: A | Discharge status: Home / Self Care | Payer: Self-pay | Source: Home / Self Care | Attending: Obstetrics and Gynecology

## 2021-05-15 ENCOUNTER — Ambulatory Visit (HOSPITAL_COMMUNITY): Payer: 59 | Admitting: Certified Registered Nurse Anesthetist

## 2021-05-15 DIAGNOSIS — D250 Submucous leiomyoma of uterus: Secondary | ICD-10-CM | POA: Insufficient documentation

## 2021-05-15 DIAGNOSIS — N920 Excessive and frequent menstruation with regular cycle: Secondary | ICD-10-CM | POA: Diagnosis not present

## 2021-05-15 DIAGNOSIS — Z791 Long term (current) use of non-steroidal anti-inflammatories (NSAID): Secondary | ICD-10-CM | POA: Insufficient documentation

## 2021-05-15 HISTORY — PX: DILATATION & CURETTAGE/HYSTEROSCOPY WITH MYOSURE: SHX6511

## 2021-05-15 LAB — TYPE AND SCREEN
ABO/RH(D): O POS
Antibody Screen: NEGATIVE

## 2021-05-15 LAB — ABO/RH: ABO/RH(D): O POS

## 2021-05-15 LAB — PREGNANCY, URINE: Preg Test, Ur: NEGATIVE

## 2021-05-15 SURGERY — DILATATION & CURETTAGE/HYSTEROSCOPY WITH MYOSURE
Anesthesia: General

## 2021-05-15 MED ORDER — FENTANYL CITRATE (PF) 100 MCG/2ML IJ SOLN: 25.0000 ug | INTRAMUSCULAR | Status: DC | PRN

## 2021-05-15 MED ORDER — SODIUM CHLORIDE 0.9 % IV SOLN
2.0000 g | INTRAVENOUS | Status: AC
  Administered 2021-05-15: 2 g via INTRAVENOUS
  Filled 2021-05-15: qty 2

## 2021-05-15 MED ORDER — MIDAZOLAM HCL 5 MG/5ML IJ SOLN
INTRAMUSCULAR | Status: DC | PRN
  Administered 2021-05-15: 2 mg via INTRAVENOUS

## 2021-05-15 MED ORDER — MIDAZOLAM HCL 2 MG/2ML IJ SOLN
INTRAMUSCULAR | Status: AC
  Filled 2021-05-15: qty 2

## 2021-05-15 MED ORDER — PROPOFOL 10 MG/ML IV BOLUS
INTRAVENOUS | Status: DC | PRN
  Administered 2021-05-15: 50 mg via INTRAVENOUS
  Administered 2021-05-15: 200 mg via INTRAVENOUS

## 2021-05-15 MED ORDER — ONDANSETRON HCL 4 MG/2ML IJ SOLN
INTRAMUSCULAR | Status: DC | PRN
  Administered 2021-05-15: 4 mg via INTRAVENOUS

## 2021-05-15 MED ORDER — FENTANYL CITRATE (PF) 250 MCG/5ML IJ SOLN
INTRAMUSCULAR | Status: AC
  Filled 2021-05-15: qty 5

## 2021-05-15 MED ORDER — CHLORHEXIDINE GLUCONATE 0.12 % MT SOLN
15.0000 mL | Freq: Once | OROMUCOSAL | Status: AC
  Administered 2021-05-15: 15 mL via OROMUCOSAL

## 2021-05-15 MED ORDER — LIDOCAINE 2% (20 MG/ML) 5 ML SYRINGE
INTRAMUSCULAR | Status: DC | PRN
  Administered 2021-05-15: 100 mg via INTRAVENOUS

## 2021-05-15 MED ORDER — 0.9 % SODIUM CHLORIDE (POUR BTL) OPTIME
TOPICAL | Status: DC | PRN
  Administered 2021-05-15: 1000 mL

## 2021-05-15 MED ORDER — SILVER NITRATE-POT NITRATE 75-25 % EX MISC
CUTANEOUS | Status: AC
  Filled 2021-05-15: qty 10

## 2021-05-15 MED ORDER — ALBUTEROL SULFATE HFA 108 (90 BASE) MCG/ACT IN AERS
INHALATION_SPRAY | RESPIRATORY_TRACT | Status: DC | PRN
  Administered 2021-05-15: 4 via RESPIRATORY_TRACT
  Administered 2021-05-15: 8 via RESPIRATORY_TRACT

## 2021-05-15 MED ORDER — ORAL CARE MOUTH RINSE: 15.0000 mL | Freq: Once | OROMUCOSAL | Status: AC

## 2021-05-15 MED ORDER — LIDOCAINE HCL 1 % IJ SOLN
INTRAMUSCULAR | Status: DC | PRN
  Administered 2021-05-15: 7 mL

## 2021-05-15 MED ORDER — DEXAMETHASONE SODIUM PHOSPHATE 10 MG/ML IJ SOLN
INTRAMUSCULAR | Status: AC
  Filled 2021-05-15: qty 1

## 2021-05-15 MED ORDER — PROPOFOL 10 MG/ML IV BOLUS
INTRAVENOUS | Status: AC
  Filled 2021-05-15: qty 20

## 2021-05-15 MED ORDER — OXYCODONE HCL 5 MG PO TABS: 5.0000 mg | ORAL_TABLET | Freq: Once | ORAL | Status: DC | PRN

## 2021-05-15 MED ORDER — LACTATED RINGERS IV SOLN: INTRAVENOUS | Status: DC

## 2021-05-15 MED ORDER — LIDOCAINE HCL (PF) 1 % IJ SOLN
INTRAMUSCULAR | Status: AC
  Filled 2021-05-15: qty 30

## 2021-05-15 MED ORDER — OXYCODONE HCL 5 MG/5ML PO SOLN: 5.0000 mg | Freq: Once | ORAL | Status: DC | PRN

## 2021-05-15 MED ORDER — ONDANSETRON HCL 4 MG/2ML IJ SOLN
INTRAMUSCULAR | Status: AC
  Filled 2021-05-15: qty 2

## 2021-05-15 MED ORDER — DEXAMETHASONE SODIUM PHOSPHATE 10 MG/ML IJ SOLN
INTRAMUSCULAR | Status: DC | PRN
  Administered 2021-05-15: 6 mg via INTRAVENOUS

## 2021-05-15 MED ORDER — POVIDONE-IODINE 10 % EX SWAB
2.0000 "application " | Freq: Once | CUTANEOUS | Status: AC
  Administered 2021-05-15: 2 via TOPICAL

## 2021-05-15 MED ORDER — SCOPOLAMINE 1 MG/3DAYS TD PT72
1.0000 | MEDICATED_PATCH | Freq: Once | TRANSDERMAL | Status: DC
  Administered 2021-05-15: 1.5 mg via TRANSDERMAL
  Filled 2021-05-15: qty 1

## 2021-05-15 MED ORDER — ACETAMINOPHEN 500 MG PO TABS
1000.0000 mg | ORAL_TABLET | Freq: Once | ORAL | Status: AC
  Administered 2021-05-15: 1000 mg via ORAL
  Filled 2021-05-15: qty 2

## 2021-05-15 MED ORDER — FENTANYL CITRATE (PF) 100 MCG/2ML IJ SOLN
INTRAMUSCULAR | Status: DC | PRN
  Administered 2021-05-15: 100 ug via INTRAVENOUS

## 2021-05-15 MED ORDER — SODIUM CHLORIDE 0.9 % IV SOLN
INTRAVENOUS | Status: DC | PRN
  Administered 2021-05-15: 6000 mL

## 2021-05-15 MED ORDER — LIDOCAINE 2% (20 MG/ML) 5 ML SYRINGE
INTRAMUSCULAR | Status: AC
  Filled 2021-05-15: qty 5

## 2021-05-15 MED ORDER — ROCURONIUM BROMIDE 10 MG/ML (PF) SYRINGE
PREFILLED_SYRINGE | INTRAVENOUS | Status: AC
  Filled 2021-05-15: qty 10

## 2021-05-15 MED ORDER — PROMETHAZINE HCL 25 MG/ML IJ SOLN
6.2500 mg | INTRAMUSCULAR | Status: DC | PRN
Start: 2021-05-15 — End: 2021-05-15

## 2021-05-15 MED ORDER — SUCCINYLCHOLINE CHLORIDE 200 MG/10ML IV SOSY
PREFILLED_SYRINGE | INTRAVENOUS | Status: DC | PRN
  Administered 2021-05-15: 160 mg via INTRAVENOUS

## 2021-05-15 MED ORDER — ALBUTEROL SULFATE HFA 108 (90 BASE) MCG/ACT IN AERS
INHALATION_SPRAY | RESPIRATORY_TRACT | Status: AC
  Filled 2021-05-15: qty 6.7

## 2021-05-15 SURGICAL SUPPLY — 24 items
BAG COUNTER SPONGE SURGICOUNT (BAG) ×2 IMPLANT
BAG SPNG CNTER NS LX DISP (BAG) ×1
CANISTER SUCT 3000ML PPV (MISCELLANEOUS) ×4 IMPLANT
CATH ROBINSON RED A/P 16FR (CATHETERS) ×2 IMPLANT
DEVICE MYOSURE LITE (MISCELLANEOUS) IMPLANT
DEVICE MYOSURE REACH (MISCELLANEOUS) IMPLANT
DILATOR CANAL MILEX (MISCELLANEOUS) IMPLANT
GAUZE 4X4 16PLY ~~LOC~~+RFID DBL (SPONGE) ×2 IMPLANT
GLOVE SURG ENC MOIS LTX SZ7 (GLOVE) ×4 IMPLANT
GOWN STRL REUS W/TWL XL LVL3 (GOWN DISPOSABLE) ×2 IMPLANT
IV NS IRRIG 3000ML ARTHROMATIC (IV SOLUTION) ×2 IMPLANT
KIT PROCEDURE FLUENT (KITS) ×2 IMPLANT
KIT TURNOVER KIT A (KITS) ×2 IMPLANT
MYOSURE XL FIBROID (MISCELLANEOUS) ×2
NDL SPNL 18GX3.5 QUINCKE PK (NEEDLE) ×1 IMPLANT
NEEDLE SPNL 18GX3.5 QUINCKE PK (NEEDLE) ×2 IMPLANT
PACK VAGINAL MINOR WOMEN LF (CUSTOM PROCEDURE TRAY) ×2 IMPLANT
PAD OB MATERNITY 4.3X12.25 (PERSONAL CARE ITEMS) ×2 IMPLANT
PAD PREP 24X48 CUFFED NSTRL (MISCELLANEOUS) ×2 IMPLANT
SEAL CERVICAL OMNI LOK (ABLATOR) IMPLANT
SEAL ROD LENS SCOPE MYOSURE (ABLATOR) ×2 IMPLANT
SUT VIC AB 2-0 UR6 27 (SUTURE) ×1 IMPLANT
SYSTEM TISS REMOVAL MYOSURE XL (MISCELLANEOUS) IMPLANT
WATER STERILE IRR 500ML POUR (IV SOLUTION) ×2 IMPLANT

## 2021-05-15 NOTE — Transfer of Care (Signed)
Immediate Anesthesia Transfer of Care Note  Patient: Brianna Morton  Procedure(s) Performed: [place]  Patient Location: PACU  Anesthesia Type:General  Level of Consciousness: awake, drowsy and patient cooperative  Airway & Oxygen Therapy: Patient Spontanous Breathing and Patient connected to face mask oxygen  Post-op Assessment: Report given to RN and Post -op Vital signs reviewed and stable  Post vital signs: Reviewed and stable  Last Vitals:  Vitals Value Taken Time  BP 136/85 05/15/21 0841  Temp 36.5 C 05/15/21 0841  Pulse 89 05/15/21 0844  Resp 23 05/15/21 0844  SpO2 100 % 05/15/21 0844  Vitals shown include unvalidated device data.  Last Pain:  Vitals:   05/15/21 0607  TempSrc:   PainSc: 0-No pain         Complications: No notable events documented.

## 2021-05-15 NOTE — Progress Notes (Signed)
The patient was re-examined with no change in status 

## 2021-05-15 NOTE — Anesthesia Postprocedure Evaluation (Signed)
Anesthesia Post Note  Patient: Brianna Morton  Procedure(s) Performed: [place]     Patient location during evaluation: PACU Anesthesia Type: General Level of consciousness: awake and alert and oriented Pain management: pain level controlled Vital Signs Assessment: post-procedure vital signs reviewed and stable Respiratory status: spontaneous breathing, nonlabored ventilation and respiratory function stable Cardiovascular status: blood pressure returned to baseline Postop Assessment: no apparent nausea or vomiting Anesthetic complications: no   No notable events documented.  Last Vitals:  Vitals:   05/15/21 0915 05/15/21 0930  BP: (!) 161/66 (!) 149/88  Pulse: 83 77  Resp: 16 18  Temp: 36.4 C 36.4 C  SpO2: 92% 98%    Last Pain:  Vitals:   05/15/21 0900  TempSrc:   PainSc: 0-No pain                 Marthenia Rolling

## 2021-05-15 NOTE — Op Note (Signed)
Preop diagnosis: Menorrhagia, submucous fibroid versus endometrial polyp  Postoperative diagnosis: Same  Procedure: Hysteroscopy, MyoSure resection of mainly submucous fibroid.  Surgeon: Matthew Saras  Anesthesia: General  EBL: Less than 10 cc  Procedure findings: Tab daily patient was taken to the operating room after an adequate level of general anesthesia was obtained, with the legs in stirrups she was prepped and draped in the bladder was drained.  This was done after appropriate timeouts were taken.  Weighted speculum was positioned, cervix grasped with a tenaculum was sounded to 9 cm, progressively dilated to a 23-25 Pratt dilator.  The continuous-flow hysteroscope was then inserted, a mostly submucous fibroid estimated to be 2-1/2 to 3 cm by prior sonohysterogram was noted.  The larger resector was positioned and gradually this was resected down to the level of the surrounding cavity.  Estimated portion another 10 to 15% may have been intramural and again was resected down to the level of the surrounding cavity.  Very minimal bleeding, she tolerated this well went to recovery room in good condition.  Dictated with [employer]  Nira Retort MD

## 2021-05-15 NOTE — Anesthesia Procedure Notes (Signed)
Procedure Name: Intubation Date/Time: 05/15/2021 7:31 AM Performed by: West Pugh, CRNA Pre-anesthesia Checklist: Patient identified, Emergency Drugs available, Suction available, Patient being monitored and Timeout performed Patient Re-evaluated:Patient Re-evaluated prior to induction Oxygen Delivery Method: Circle system utilized Preoxygenation: Pre-oxygenation with 100% oxygen Induction Type: IV induction Ventilation: Mask ventilation without difficulty Laryngoscope Size: Mac and 3 Grade View: Grade I Tube type: Oral Tube size: 7.0 mm Number of attempts: 1 Airway Equipment and Method: Stylet Placement Confirmation: ETT inserted through vocal cords under direct vision, positive ETCO2 and breath sounds checked- equal and bilateral Secured at: 21 cm Tube secured with: Tape Dental Injury: Teeth and Oropharynx as per pre-operative assessment

## 2021-05-16 ENCOUNTER — Encounter (HOSPITAL_COMMUNITY): Payer: Self-pay | Admitting: Obstetrics and Gynecology

## 2021-05-16 LAB — SURGICAL PATHOLOGY
